# Patient Record
Sex: Female | Born: 1990 | Race: White | Hispanic: Yes | Marital: Single | Smoking: Never smoker
Health system: Southern US, Community
[De-identification: ages and names within clinical notes are randomized; demographics above are authoritative.]

## PROBLEM LIST (undated history)

## (undated) HISTORY — PX: LIPOSUCTION: SHX10

## (undated) HISTORY — PX: BREAST SURGERY: SHX581

---
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
## 2011-03-11 ENCOUNTER — Encounter: Payer: Self-pay | Admitting: *Deleted

## 2011-03-11 ENCOUNTER — Emergency Department (HOSPITAL_BASED_OUTPATIENT_CLINIC_OR_DEPARTMENT_OTHER)
Admission: EM | Admit: 2011-03-11 | Discharge: 2011-03-12 | Disposition: A | Discharge status: Home / Self Care | Payer: Self-pay | Attending: Emergency Medicine | Admitting: Emergency Medicine

## 2011-03-11 DIAGNOSIS — R109 Unspecified abdominal pain: Secondary | ICD-10-CM | POA: Insufficient documentation

## 2011-03-11 DIAGNOSIS — N390 Urinary tract infection, site not specified: Secondary | ICD-10-CM

## 2011-03-11 DIAGNOSIS — K5900 Constipation, unspecified: Secondary | ICD-10-CM | POA: Insufficient documentation

## 2011-03-11 LAB — URINALYSIS, ROUTINE W REFLEX MICROSCOPIC
Glucose, UA: NEGATIVE mg/dL
Ketones, ur: NEGATIVE mg/dL
Nitrite: POSITIVE — AB
Protein, ur: NEGATIVE mg/dL
Specific Gravity, Urine: 1.014 (ref 1.005–1.030)
Urobilinogen, UA: 0.2 mg/dL (ref 0.0–1.0)

## 2011-03-11 LAB — PREGNANCY, URINE: Preg Test, Ur: NEGATIVE

## 2011-03-11 LAB — URINE MICROSCOPIC-ADD ON

## 2011-03-11 NOTE — ED Notes (Signed)
Pt sts she has been anable to have a BM for 4 days and normally goes BID. Pt c/o abd pain and feeling bloated.

## 2011-03-12 ENCOUNTER — Emergency Department (INDEPENDENT_AMBULATORY_CARE_PROVIDER_SITE_OTHER): Payer: Self-pay

## 2011-03-12 DIAGNOSIS — R109 Unspecified abdominal pain: Secondary | ICD-10-CM

## 2011-03-12 DIAGNOSIS — K5900 Constipation, unspecified: Secondary | ICD-10-CM

## 2011-03-12 MED ORDER — NITROFURANTOIN MONOHYD MACRO 100 MG PO CAPS: 100.0000 mg | ORAL_CAPSULE | Freq: Two times a day (BID) | ORAL | Status: AC

## 2011-03-12 NOTE — ED Provider Notes (Signed)
History     CSN: [id number] Arrival date & time: 03/11/2011 11:08 PM   Chief Complaint  Patient presents with  . Constipation     (Include location/radiation/quality/duration/timing/severity/associated sxs/prior treatment) Patient is a 20 y.o. female presenting with constipation. The history is provided by the patient. No language interpreter was used.  Constipation  The current episode started 3 to 5 days ago. The onset was gradual. The problem occurs continuously. The problem has been unchanged. The pain is mild. The stool is described as hard. There was no prior successful therapy. There was no prior unsuccessful therapy. Associated symptoms include hematemesis. Pertinent negatives include no anorexia, no fever, no diarrhea, no hemorrhoids, no nausea, no rectal pain, no vomiting, no hematuria, no vaginal bleeding, no vaginal discharge, no chest pain, no headaches, no coughing and no difficulty breathing. She has been behaving normally. She has been eating and drinking normally. The last void occurred less than 6 hours ago. Her past medical history does not include abdominal surgery, developmental delay, Hirschsprung's disease, inflammatory bowel disease, recent abdominal injury, recent antibiotic use, recent change in diet or a recent illness. There were no sick contacts. She has received no recent medical care.  Normally has 2 BM a day none x 4 days.  Today had suprapubic tenderness without other urinary or vaginal symptoms.  No f/c/r.     History reviewed. No pertinent past medical history.   History reviewed. No pertinent past surgical history.  No family history on file.  History  Substance Use Topics  . Smoking status: Never Smoker   . Smokeless tobacco: Not on file  . Alcohol Use: No    OB History    Grav Para Term Preterm Abortions TAB SAB Ect Mult Living                  Review of Systems  Constitutional: Negative for fever.  HENT: Negative for facial swelling.     Eyes: Negative for discharge.  Respiratory: Negative for cough.   Cardiovascular: Negative for chest pain.  Gastrointestinal: Positive for constipation and hematemesis. Negative for nausea, vomiting, diarrhea, abdominal distention, rectal pain, anorexia and hemorrhoids.  Genitourinary: Negative for hematuria, vaginal bleeding and vaginal discharge.  Musculoskeletal: Positive for arthralgias.  Neurological: Negative for headaches.  Hematological: Negative.   Psychiatric/Behavioral: Negative.     Allergies  Review of patient's allergies indicates no known allergies.  Home Medications  No current outpatient prescriptions on file.  Physical Exam    BP 115/75  Pulse 112  Temp(Src) 98.1 F (36.7 C) (Oral)  Resp 16  Ht 5\' 7"  (1.702 m)  Wt 131 lb (59.421 kg)  BMI 20.52 kg/m2  SpO2 98%  LMP 02/26/2011  Physical Exam  Constitutional: She is oriented to person, place, and time. She appears well-developed and well-nourished. No distress.  HENT:  Head: Normocephalic and atraumatic.  Mouth/Throat: Oropharynx is clear and moist.  Eyes: EOM are normal. Pupils are equal, round, and reactive to light.  Neck: Normal range of motion. Neck supple.  Cardiovascular: Normal rate and regular rhythm.   Pulmonary/Chest: Effort normal and breath sounds normal. No respiratory distress.  Abdominal: Bowel sounds are normal. She exhibits no distension and no mass. There is no tenderness. There is no rebound and no guarding.  Musculoskeletal: Normal range of motion.  Neurological: She is alert and oriented to person, place, and time.  Skin: Skin is warm and dry.  Psychiatric: She has a normal mood and affect.    ED Course  Procedures  Results for orders placed during the hospital encounter of 03/11/11  URINALYSIS, ROUTINE W REFLEX MICROSCOPIC      Component Value Range   Color, Urine YELLOW  YELLOW    Appearance CLOUDY (*) CLEAR    Specific Gravity, Urine 1.014  1.005 - 1.030    pH 6.5  5.0 -  8.0    Glucose, UA NEGATIVE  NEGATIVE (mg/dL)   Hgb urine dipstick SMALL (*) NEGATIVE    Bilirubin Urine NEGATIVE  NEGATIVE    Ketones, ur NEGATIVE  NEGATIVE (mg/dL)   Protein, ur NEGATIVE  NEGATIVE (mg/dL)   Urobilinogen, UA 0.2  0.0 - 1.0 (mg/dL)   Nitrite POSITIVE (*) NEGATIVE    Leukocytes, UA LARGE (*) NEGATIVE   PREGNANCY, URINE      Component Value Range   Preg Test, Ur NEGATIVE    URINE MICROSCOPIC-ADD ON      Component Value Range   Squamous Epithelial / LPF FEW (*) RARE    WBC, UA TOO NUMEROUS TO COUNT  <3 (WBC/hpf)   RBC / HPF 3-6  <3 (RBC/hpf)   Bacteria, UA MANY (*) RARE    Dg Abd Acute W/chest  03/12/2011  *RADIOLOGY REPORT*  Clinical Data: 20 year old female with abdominal pain and constipation.  ACUTE ABDOMEN SERIES (ABDOMEN 2 VIEW & CHEST 1 VIEW)  Comparison: None  Findings: The cardiomediastinal silhouette is unremarkable. There is no evidence of airspace disease, pleural effusion or pneumothorax.  A moderate amount of stool in the right colon is noted. Gas in the transverse colon is identified. There is no evidence of bowel obstruction or pneumoperitoneum. No bony abnormalities are identified.  IMPRESSION: Moderate right colonic stool.  No evidence of bowel obstruction or pneumoperitoneum.  No evidence of acute cardiopulmonary disease.  Original Report Authenticated By: Rosendo Gros, M.D.     No diagnosis found.   MDM Use colace and miralax, take all antibiotics.  Return for fevers, chills, worsening pain, vomiting, or any concerns.  Follow up with your family doctor in 2 days for recheck       Nealie Mchatton K Yechiel Erny-Rasch, MD 03/12/11 [phone]

## 2011-08-21 ENCOUNTER — Emergency Department (HOSPITAL_BASED_OUTPATIENT_CLINIC_OR_DEPARTMENT_OTHER)
Admission: EM | Admit: 2011-08-21 | Discharge: 2011-08-21 | Disposition: A | Discharge status: Home / Self Care | Payer: Self-pay | Attending: Emergency Medicine | Admitting: Emergency Medicine

## 2011-08-21 ENCOUNTER — Encounter (HOSPITAL_BASED_OUTPATIENT_CLINIC_OR_DEPARTMENT_OTHER): Payer: Self-pay | Admitting: *Deleted

## 2011-08-21 DIAGNOSIS — N390 Urinary tract infection, site not specified: Secondary | ICD-10-CM | POA: Insufficient documentation

## 2011-08-21 LAB — URINALYSIS, ROUTINE W REFLEX MICROSCOPIC
Glucose, UA: NEGATIVE mg/dL
Ketones, ur: 15 mg/dL — AB
Protein, ur: 100 mg/dL — AB
Urobilinogen, UA: 1 mg/dL (ref 0.0–1.0)

## 2011-08-21 LAB — URINE MICROSCOPIC-ADD ON

## 2011-08-21 MED ORDER — NITROFURANTOIN MONOHYD MACRO 100 MG PO CAPS: 100.0000 mg | ORAL_CAPSULE | Freq: Two times a day (BID) | ORAL | Status: AC

## 2011-08-21 MED ORDER — PHENAZOPYRIDINE HCL 200 MG PO TABS: 200.0000 mg | ORAL_TABLET | Freq: Three times a day (TID) | ORAL | Status: AC

## 2011-08-21 MED ORDER — NITROFURANTOIN MONOHYD MACRO 100 MG PO CAPS
100.0000 mg | ORAL_CAPSULE | Freq: Once | ORAL | Status: AC
  Administered 2011-08-21: 100 mg via ORAL
  Filled 2011-08-21: qty 1

## 2011-08-21 MED ORDER — PHENAZOPYRIDINE HCL 100 MG PO TABS
200.0000 mg | ORAL_TABLET | Freq: Once | ORAL | Status: AC
  Administered 2011-08-21: 200 mg via ORAL
  Filled 2011-08-21 (×2): qty 1

## 2011-08-21 NOTE — Discharge Instructions (Signed)
Urinary Tract Infection Infections of the urinary tract can start in several places. A bladder infection (cystitis), a kidney infection (pyelonephritis), and a prostate infection (prostatitis) are different types of urinary tract infections (UTIs). They usually get better if treated with medicines (antibiotics) that kill germs. Take all the medicine until it is gone. You or your child may feel better in a few days, but TAKE ALL MEDICINE or the infection may not respond and may become more difficult to treat. HOME CARE INSTRUCTIONS   Drink enough water and fluids to keep the urine clear or pale yellow. Cranberry juice is especially recommended, in addition to large amounts of water.   Avoid caffeine, tea, and carbonated beverages. They tend to irritate the bladder.   Alcohol may irritate the prostate.   Only take over-the-counter or prescription medicines for pain, discomfort, or fever as directed by your caregiver.  To prevent further infections:  Empty the bladder often. Avoid holding urine for long periods of time.   After a bowel movement, women should cleanse from front to back. Use each tissue only once.   Empty the bladder before and after sexual intercourse.  FINDING OUT THE RESULTS OF YOUR TEST Not all test results are available during your visit. If your or your child's test results are not back during the visit, make an appointment with your caregiver to find out the results. Do not assume everything is normal if you have not heard from your caregiver or the medical facility. It is important for you to follow up on all test results. SEEK MEDICAL CARE IF:   There is back pain.   Your baby is older than 3 months with a rectal temperature of 100.5 F (38.1 C) or higher for more than 1 day.   Your or your child's problems (symptoms) are no better in 3 days. Return sooner if you or your child is getting worse.  SEEK IMMEDIATE MEDICAL CARE IF:   There is severe back pain or lower  abdominal pain.   You or your child develops chills.   You have a fever.   Your baby is older than 3 months with a rectal temperature of 102 F (38.9 C) or higher.   Your baby is 3 months old or younger with a rectal temperature of 100.4 F (38 C) or higher.   There is nausea or vomiting.   There is continued burning or discomfort with urination.  MAKE SURE YOU:   Understand these instructions.   Will watch your condition.   Will get help right away if you are not doing well or get worse.  Document Released: 03/18/2005 Document Revised: 02/18/2011 Document Reviewed: 10/21/2006 [employer] Patient Information 2012 [employer]  Nitrofurantoin tablets or capsules What is this medicine? NITROFURANTOIN (nye troe fyoor AN toyn) is an antibiotic. It is used to treat urinary tract infections. This medicine may be used for other purposes; ask your health care provider or pharmacist if you have questions. What should I tell my health care provider before I take this medicine? They need to know if you have any of these conditions: -anemia -diabetes -glucose-6-phosphate dehydrogenase deficiency -kidney disease -liver disease -lung disease -other chronic illness -an unusual or allergic reaction to nitrofurantoin, other antibiotics, other medicines, foods, dyes or preservatives -pregnant or trying to get pregnant -breast-feeding How should I use this medicine? Take this medicine by mouth with a glass of water. Follow the directions on the prescription label. Take this medicine with food or milk. Take   your doses at regular intervals. Do not take your medicine more often than directed. Do not stop taking except on your doctor's advice. Talk to your pediatrician regarding the use of this medicine in children. While this drug may be prescribed for selected conditions, precautions do apply. Overdosage: If you think you have taken too much of this medicine contact a poison control center or  emergency room at once. NOTE: This medicine is only for you. Do not share this medicine with others. What if I miss a dose? If you miss a dose, take it as soon as you can. If it is almost time for your next dose, take only that dose. Do not take double or extra doses. What may interact with this medicine? -antacids containing magnesium trisilicate -probenecid -quinolone antibiotics like ciprofloxacin, lomefloxacin, norfloxacin and ofloxacin -sulfinpyrazone This list may not describe all possible interactions. Give your health care provider a list of all the medicines, herbs, non-prescription drugs, or dietary supplements you use. Also tell them if you smoke, drink alcohol, or use illegal drugs. Some items may interact with your medicine. What should I watch for while using this medicine? Tell your doctor or health care professional if your symptoms do not improve or if you get new symptoms. Drink several glasses of water a day. If you are taking this medicine for a long time, visit your doctor for regular checks on your progress. If you are diabetic, you may get a false positive result for sugar in your urine with certain brands of urine tests. Check with your doctor. What side effects may I notice from receiving this medicine? Side effects that you should report to your doctor or health care professional as soon as possible: -allergic reactions like skin rash or hives, swelling of the face, lips, or tongue -chest pain -cough -difficulty breathing -dizziness, drowsiness -fever or infection -joint aches or pains -pale or blue-tinted skin -redness, blistering, peeling or loosening of the skin, including inside the mouth -tingling, burning, pain, or numbness in hands or feet -unusual bleeding or bruising -unusually weak or tired -yellowing of eyes or skin Side effects that usually do not require medical attention (report to your doctor or health care professional if they continue or are  bothersome): -dark urine -diarrhea -headache -loss of appetite -nausea or vomiting -temporary hair loss This list may not describe all possible side effects. Call your doctor for medical advice about side effects. You may report side effects to FDA at 1-800-FDA-1088. Where should I keep my medicine? Keep out of the reach of children. Store at room temperature between 15 and 30 degrees C (59 and 86 degrees F). Protect from light. Throw away any unused medicine after the expiration date. NOTE: This sheet is a summary. It may not cover all possible information. If you have questions about this medicine, talk to your doctor, pharmacist, or health care provider.  2012, [employer]. (12/28/2007 3:56:47 PM)Phenazopyridine tablets What is this medicine? PHENAZOPYRIDINE (fen az oh PEER i deen) is a pain reliever. It is used to stop the pain, burning, or discomfort caused by infection or irritation of the urinary tract. This medicine is not an antibiotic. It will not cure a urinary tract infection. This medicine may be used for other purposes; ask your health care provider or pharmacist if you have questions. What should I tell my health care provider before I take this medicine? They need to know if you have any of these conditions: -glucose-6-phosphate dehydrogenase (G6PD) deficiency -kidney disease -an   unusual or allergic reaction to phenazopyridine, other medicines, foods, dyes, or preservatives -pregnant or trying to get pregnant -breast-feeding How should I use this medicine? Take this medicine by mouth with a glass of water. Follow the directions on the prescription label. Take after meals. Take your doses at regular intervals. Do not take your medicine more often than directed. Do not skip doses or stop your medicine early even if you feel better. Do not stop taking except on your doctor's advice. Talk to your pediatrician regarding the use of this medicine in children. Special care  may be needed. Overdosage: If you think you have taken too much of this medicine contact a poison control center or emergency room at once. NOTE: This medicine is only for you. Do not share this medicine with others. What if I miss a dose? If you miss a dose, take it as soon as you can. If it is almost time for your next dose, take only that dose. Do not take double or extra doses. What may interact with this medicine? Interactions are not expected. This list may not describe all possible interactions. Give your health care provider a list of all the medicines, herbs, non-prescription drugs, or dietary supplements you use. Also tell them if you smoke, drink alcohol, or use illegal drugs. Some items may interact with your medicine. What should I watch for while using this medicine? Tell your doctor or health care professional if your symptoms do not improve or if they get worse. This medicine colors body fluids red. This effect is harmless and will go away after you are done taking the medicine. It will change urine to an dark orange or red color. The red color may stain clothing. Soft contact lenses may become permanently stained. It is best not to wear soft contact lenses while taking this medicine. If you are diabetic you may get a false positive result for sugar in your urine. Talk to your health care provider. What side effects may I notice from receiving this medicine? Side effects that you should report to your doctor or health care professional as soon as possible: -allergic reactions like skin rash, itching or hives, swelling of the face, lips, or tongue -blue or purple color of the skin -difficulty breathing -fever -less urine -unusual bleeding, bruising -unusual tired, weak -vomiting -yellowing of the eyes or skin Side effects that usually do not require medical attention (report to your doctor or health care professional if they continue or are bothersome): -dark  urine -headache -stomach upset This list may not describe all possible side effects. Call your doctor for medical advice about side effects. You may report side effects to FDA at 1-800-FDA-1088. Where should I keep my medicine? Keep out of the reach of children. Store at room temperature between 15 and 30 degrees C (59 and 86 degrees F). Protect from light and moisture. Throw away any unused medicine after the expiration date. NOTE: This sheet is a summary. It may not cover all possible information. If you have questions about this medicine, talk to your doctor, pharmacist, or health care provider.  2012, [employer]. (01/05/2008 11:04:07 AM) 

## 2011-08-21 NOTE — ED Notes (Signed)
Lower abdominal pain. Dysuria today.

## 2011-08-21 NOTE — ED Provider Notes (Signed)
History     CSN: [id number]  Arrival date & time 08/21/11  2218   First MD Initiated Contact with Patient 08/21/11 [id number]      Chief Complaint  Patient presents with  . Urinary Tract Infection    (Consider location/radiation/quality/duration/timing/severity/associated sxs/prior treatment) Patient is a 21 y.o. female presenting with urinary tract infection. The history is provided by the patient.  Urinary Tract Infection  She has been having mild suprapubic pain for the last 5 days which she thought was due to premenstrual pain. Today, she started having dysuria as well as urinary urgency, frequency, and tenesmus. Suprapubic pain has gotten worse with the new symptoms, and is worse with urination. Pain is 6/10 at rest, but 9/10 when she urinates. She denies flank pain. She denies fever, chills, sweats. She denies nausea, vomiting, diarrhea. She has had urinary tract infections in the past and they felt similar to this. Symptoms are described as moderate to severe. Nothing makes it better and nothing makes it worse.  History reviewed. No pertinent past medical history.  History reviewed. No pertinent past surgical history.  No family history on file.  History  Substance Use Topics  . Smoking status: Never Smoker   . Smokeless tobacco: Not on file  . Alcohol Use: No    OB History    Grav Para Term Preterm Abortions TAB SAB Ect Mult Living                  Review of Systems  All other systems reviewed and are negative.    Allergies  Review of patient's allergies indicates no known allergies.  Home Medications  No current outpatient prescriptions on file.  BP 111/66  Pulse 72  Temp(Src) 97.9 F (36.6 C) (Oral)  Resp 18  SpO2 100%  LMP 07/29/2011  Physical Exam  Nursing note and vitals reviewed.  20 year old female who is resting comfortably and in no acute distress. Vital signs are normal. Oxygen saturation is 100% which is normal. Head is normocephalic and  atraumatic. PERRLA, EOMI. Oropharynx is clear. Neck is nontender and supple without adenopathy or JVD. Back is nontender and there is no CVA tenderness. Lungs are clear without rales, wheezes, or rhonchi. Heart has regular rate and rhythm without murmur. Abdomen is soft, flat, with mild suprapubic tenderness. There is no rebound or guarding. Peristalsis is normal active. Extremities have no cyanosis or edema, full range of motion is present. Skin is warm and dry without rash. Neurologic: Mental status is normal, cranial nerves are intact, there no focal motor or sensory deficits.  ED Course  Procedures (including critical care time)  Results for orders placed during the hospital encounter of 08/21/11  URINALYSIS, ROUTINE W REFLEX MICROSCOPIC      Component Value Range   Color, Urine YELLOW  YELLOW    APPearance TURBID (*) CLEAR    Specific Gravity, Urine 1.034 (*) 1.005 - 1.030    pH 6.0  5.0 - 8.0    Glucose, UA NEGATIVE  NEGATIVE (mg/dL)   Hgb urine dipstick LARGE (*) NEGATIVE    Bilirubin Urine NEGATIVE  NEGATIVE    Ketones, ur 15 (*) NEGATIVE (mg/dL)   Protein, ur 782 (*) NEGATIVE (mg/dL)   Urobilinogen, UA 1.0  0.0 - 1.0 (mg/dL)   Nitrite POSITIVE (*) NEGATIVE    Leukocytes, UA LARGE (*) NEGATIVE   PREGNANCY, URINE      Component Value Range   Preg Test, Ur NEGATIVE  NEGATIVE   URINE MICROSCOPIC-ADD ON  Component Value Range   Squamous Epithelial / LPF FEW (*) RARE    WBC, UA TOO NUMEROUS TO COUNT  <3 (WBC/hpf)   RBC / HPF 11-20  <3 (RBC/hpf)   Bacteria, UA FEW (*) RARE    Urinalysis confirms urinary tract infection. She's given a dose of nitrofurantoin and phenazopyradine in the emergency department and sent home with prescriptions for both of those.   1. Urinary tract infection       MDM  Probable urinary tract infection. Old charts have been reviewed and she does have prior ED visits for urinary tract infection.        Dione Booze, MD 08/21/11 [phone]

## 2012-04-25 ENCOUNTER — Encounter (HOSPITAL_BASED_OUTPATIENT_CLINIC_OR_DEPARTMENT_OTHER): Payer: Self-pay | Admitting: *Deleted

## 2012-04-25 ENCOUNTER — Emergency Department (HOSPITAL_BASED_OUTPATIENT_CLINIC_OR_DEPARTMENT_OTHER)
Admission: EM | Admit: 2012-04-25 | Discharge: 2012-04-25 | Disposition: A | Discharge status: Home / Self Care | Payer: Self-pay | Attending: Emergency Medicine | Admitting: Emergency Medicine

## 2012-04-25 DIAGNOSIS — H109 Unspecified conjunctivitis: Secondary | ICD-10-CM | POA: Insufficient documentation

## 2012-04-25 MED ORDER — FLUORESCEIN SODIUM 1 MG OP STRP
1.0000 | ORAL_STRIP | Freq: Once | OPHTHALMIC | Status: AC
  Administered 2012-04-25: 1 via OPHTHALMIC
  Filled 2012-04-25: qty 1

## 2012-04-25 MED ORDER — CIPROFLOXACIN HCL 0.3 % OP SOLN: 2.0000 [drp] | OPHTHALMIC | Status: DC

## 2012-04-25 MED ORDER — CIPROFLOXACIN HCL 0.3 % OP SOLN
OPHTHALMIC | Status: AC
  Filled 2012-04-25: qty 2.5

## 2012-04-25 MED ORDER — TETRACAINE HCL 0.5 % OP SOLN
2.0000 [drp] | Freq: Once | OPHTHALMIC | Status: AC
  Administered 2012-04-25: 2 [drp] via OPHTHALMIC
  Filled 2012-04-25: qty 2

## 2012-04-25 NOTE — ED Provider Notes (Signed)
History     CSN: [id number]  Arrival date & time 04/25/12  7829   First MD Initiated Contact with Patient 04/25/12 [phone]      Chief Complaint  Patient presents with  . Conjunctivitis    (Consider location/radiation/quality/duration/timing/severity/associated sxs/prior treatment) HPI Pt with history of contact lens use reports she woke up this morning with crusting of her L eye, redness, irritation and mild photophobia. She did not put her contact lenses in today. She reports a couple of days of rhinorrhea she attributed to allergies.   History reviewed. No pertinent past medical history.  History reviewed. No pertinent past surgical history.  History reviewed. No pertinent family history.  History  Substance Use Topics  . Smoking status: Never Smoker   . Smokeless tobacco: Not on file  . Alcohol Use: No    OB History    Grav Para Term Preterm Abortions TAB SAB Ect Mult Living                  Review of Systems All other systems reviewed and are negative except as noted in HPI.   Allergies  Review of patient's allergies indicates no known allergies.  Home Medications   Current Outpatient Rx  Name  Route  Sig  Dispense  Refill  . IBUPROFEN 200 MG PO CAPS   Oral   Take 2 capsules by mouth daily as needed. Patient used this medication for what she thought may be cramps.           BP 123/76  Pulse 92  Temp 98.4 F (36.9 C) (Oral)  Resp 18  Ht 5\' 6"  (1.676 m)  Wt 143 lb (64.864 kg)  BMI 23.08 kg/m2  SpO2 100%  Physical Exam  Constitutional: She is oriented to person, place, and time. She appears well-developed and well-nourished.  HENT:  Head: Normocephalic and atraumatic.  Eyes: Pupils are equal, round, and reactive to light. Right eye exhibits no discharge. Left eye exhibits discharge.       conjunctival injection and mild chemosis on the Left. No corneal abrasion on fluorescein exam. No foreign body.  Neck: Neck supple.  Pulmonary/Chest: Effort  normal.  Neurological: She is alert and oriented to person, place, and time. No cranial nerve deficit.  Psychiatric: She has a normal mood and affect. Her behavior is normal.    ED Course  Procedures (including critical care time)  Labs Reviewed - No data to display No results found.   No diagnosis found.    MDM  Cipro drops, advised to dispose of her current contacts and not wear any more until symptoms have cleared.         Charles B. Bernette Mayers, MD 04/25/12 [phone]

## 2012-04-25 NOTE — ED Notes (Signed)
Woke up with left eye red crusty and burning sensitive to light

## 2012-12-14 ENCOUNTER — Emergency Department (HOSPITAL_BASED_OUTPATIENT_CLINIC_OR_DEPARTMENT_OTHER)
Admission: EM | Admit: 2012-12-14 | Discharge: 2012-12-14 | Disposition: A | Discharge status: Home / Self Care | Payer: Self-pay | Attending: Emergency Medicine | Admitting: Emergency Medicine

## 2012-12-14 ENCOUNTER — Emergency Department (HOSPITAL_BASED_OUTPATIENT_CLINIC_OR_DEPARTMENT_OTHER): Payer: Self-pay

## 2012-12-14 ENCOUNTER — Encounter (HOSPITAL_BASED_OUTPATIENT_CLINIC_OR_DEPARTMENT_OTHER): Payer: Self-pay

## 2012-12-14 DIAGNOSIS — K5900 Constipation, unspecified: Secondary | ICD-10-CM | POA: Insufficient documentation

## 2012-12-14 DIAGNOSIS — R109 Unspecified abdominal pain: Secondary | ICD-10-CM | POA: Insufficient documentation

## 2012-12-14 DIAGNOSIS — Z3202 Encounter for pregnancy test, result negative: Secondary | ICD-10-CM | POA: Insufficient documentation

## 2012-12-14 LAB — URINALYSIS, ROUTINE W REFLEX MICROSCOPIC
Glucose, UA: NEGATIVE mg/dL
Nitrite: NEGATIVE
Protein, ur: NEGATIVE mg/dL
Urobilinogen, UA: 0.2 mg/dL (ref 0.0–1.0)

## 2012-12-14 LAB — PREGNANCY, URINE: Preg Test, Ur: NEGATIVE

## 2012-12-14 MED ORDER — POLYETHYLENE GLYCOL 3350 17 G PO PACK: 17.0000 g | PACK | Freq: Every day | ORAL | Status: DC

## 2012-12-14 NOTE — ED Provider Notes (Signed)
History    CSN: [id number] Arrival date & time 12/14/12  1200  First MD Initiated Contact with Patient 12/14/12 1229     Chief Complaint  Patient presents with  . Constipation  . Abdominal Pain   (Consider location/radiation/quality/duration/timing/severity/associated sxs/prior Treatment) Patient is a 22 y.o. female presenting with constipation and abdominal pain. The history is provided by the patient. No language interpreter was used.  Constipation Severity:  Mild Timing:  Constant Progression:  Worsening Chronicity:  New Stool description:  Hard Relieved by:  Nothing Worsened by:  Nothing tried Associated symptoms: abdominal pain   Abdominal Pain Associated symptoms include abdominal pain.  Pt complains of constipation.   Pt reports she was straining and almost passed out from straining so hard.   History reviewed. No pertinent past medical history. History reviewed. No pertinent past surgical history. No family history on file. History  Substance Use Topics  . Smoking status: Never Smoker   . Smokeless tobacco: Not on file  . Alcohol Use: No   OB History   Grav Para Term Preterm Abortions TAB SAB Ect Mult Living                 Review of Systems  Gastrointestinal: Positive for abdominal pain and constipation.  All other systems reviewed and are negative.    Allergies  Review of patient's allergies indicates no known allergies.  Home Medications   Current Outpatient Rx  Name  Route  Sig  Dispense  Refill  . Ibuprofen (ADVIL) 200 MG CAPS   Oral   Take 2 capsules by mouth daily as needed. Patient used this medication for what she thought may be cramps.         . polyethylene glycol (MIRALAX) packet   Oral   Take 17 g by mouth daily.   14 each   0    BP 104/56  Pulse 86  Temp(Src) 98.5 F (36.9 C) (Oral)  Resp 16  Ht 5\' 6"  (1.676 m)  Wt 140 lb (63.504 kg)  BMI 22.61 kg/m2  SpO2 100%  LMP 11/30/2012 Physical Exam  Nursing note and vitals  reviewed. Constitutional: She appears well-developed and well-nourished.  HENT:  Head: Normocephalic.  Eyes: Pupils are equal, round, and reactive to light.  Neck: Normal range of motion.  Cardiovascular: Normal rate.   Pulmonary/Chest: Effort normal.  Abdominal: Soft. Bowel sounds are normal. There is no tenderness.  Musculoskeletal: Normal range of motion.  Neurological: She is alert.  Skin: Skin is warm.  Psychiatric: She has a normal mood and affect.    ED Course  Procedures (including critical care time) Labs Reviewed  URINALYSIS, ROUTINE W REFLEX MICROSCOPIC - Abnormal; Notable for the following:    Leukocytes, UA SMALL (*)    All other components within normal limits  PREGNANCY, URINE  URINE MICROSCOPIC-ADD ON   Dg Abd 1 View  12/14/2012   *RADIOLOGY REPORT*  Clinical Data: 22 year old female with abdominal pain and tenderness.  ABDOMEN - 1 VIEW  Comparison: 03/12/2011.  Findings: Nonobstructed bowel gas pattern.  There is retained stool in the colon, increased from prior.  The rectosigmoid colon and distal descending are relatively spared.  Abdominal and pelvic visceral contours are within normal limits. No osseous abnormality identified.  No definite pneumoperitoneum.  IMPRESSION: Nonobstructed bowel gas pattern.   Original Report Authenticated By: Erskine Speed, M.D.   1. Constipation    Negative ua and upt negative.  MDM  Pt given rx for miralax,  I advised increase fluids, add fiber cereal to diet  Elson Areas, PA-C 12/14/12 1538

## 2012-12-14 NOTE — ED Provider Notes (Signed)
Medical screening examination/treatment/procedure(s) were performed by non-physician practitioner and as supervising physician I was immediately available for consultation/collaboration.   Charles B. Bernette Mayers, MD 12/14/12 1539

## 2012-12-14 NOTE — ED Notes (Signed)
Pt states she is constipated-was in the [place] attempting to deficate when she became dizzy and lightheaded-pt A?O -was in w/c in ED [facility]

## 2014-03-05 DIAGNOSIS — K5904 Chronic idiopathic constipation: Secondary | ICD-10-CM | POA: Insufficient documentation

## 2017-09-23 DIAGNOSIS — L0231 Cutaneous abscess of buttock: Secondary | ICD-10-CM | POA: Diagnosis present

## 2017-09-23 DIAGNOSIS — Z79899 Other long term (current) drug therapy: Secondary | ICD-10-CM | POA: Insufficient documentation

## 2017-09-23 DIAGNOSIS — R030 Elevated blood-pressure reading, without diagnosis of hypertension: Secondary | ICD-10-CM | POA: Diagnosis not present

## 2017-09-24 ENCOUNTER — Encounter (HOSPITAL_BASED_OUTPATIENT_CLINIC_OR_DEPARTMENT_OTHER): Payer: Self-pay

## 2017-09-24 ENCOUNTER — Emergency Department (HOSPITAL_BASED_OUTPATIENT_CLINIC_OR_DEPARTMENT_OTHER)
Admission: EM | Admit: 2017-09-24 | Discharge: 2017-09-24 | Disposition: A | Discharge status: Home / Self Care | Payer: Federal, State, Local not specified - PPO | Attending: Emergency Medicine | Admitting: Emergency Medicine

## 2017-09-24 ENCOUNTER — Other Ambulatory Visit: Payer: Self-pay

## 2017-09-24 DIAGNOSIS — L0231 Cutaneous abscess of buttock: Secondary | ICD-10-CM

## 2017-09-24 LAB — PREGNANCY, URINE: Preg Test, Ur: NEGATIVE

## 2017-09-24 MED ORDER — DOXYCYCLINE HYCLATE 100 MG PO TABS
100.0000 mg | ORAL_TABLET | Freq: Once | ORAL | Status: AC
  Administered 2017-09-24: 100 mg via ORAL
  Filled 2017-09-24: qty 1

## 2017-09-24 MED ORDER — DOXYCYCLINE HYCLATE 100 MG PO CAPS
100.0000 mg | ORAL_CAPSULE | Freq: Two times a day (BID) | ORAL | 0 refills | Status: DC
Start: 2017-09-24 — End: 2023-10-05

## 2017-09-24 NOTE — ED Triage Notes (Signed)
Pt reports having multiple abcessess to her vagina. Pt reports pus drainage to one. Pt has seen OBGYN- no abx, no drainage. Was told they were boils.

## 2017-09-24 NOTE — ED Provider Notes (Signed)
[facility] Provider Note: Deborah DellJ. Lane Jordi Kamm, MD, FACEP  CSN: [id number] MRN: [id number] ARRIVAL: 09/23/17 at 2354 ROOM: [facility]12/[facility]12   CHIEF COMPLAINT  Abscess   HISTORY OF PRESENT ILLNESS  09/24/17 2:23 AM Deborah Rodgers is a 27 y.o. female who has had several tender, indurated lesions of the medial buttocks and mons pubis beginning about 2 and half weeks ago.  1 of them opened on its own and drained foul-smelling pus.  [facility] and tested for STDs and was told she was negative.  They told her she had boils but did not I&D them or place her on an antibiotic.  She states the lesions are moderately painful especially with walking.   History reviewed. No pertinent past medical history.  History reviewed. No pertinent surgical history.  No family history on file.  Social History   Tobacco Use  . Smoking status: Never Smoker  . Smokeless tobacco: Never Used  Substance Use Topics  . Alcohol use: No  . Drug use: No    Prior to Admission medications   Medication Sig Start Date End Date Taking? Authorizing Provider  Ibuprofen (ADVIL) 200 MG CAPS Take 2 capsules by mouth daily as needed. Patient used this medication for what she thought may be cramps.    [provider]  polyethylene glycol (MIRALAX) packet Take 17 g by mouth daily. 12/14/12   Elson AreasSofia, Leslie K, PA-C    Allergies Patient has no known allergies.   REVIEW OF SYSTEMS  Negative except as noted here or in the History of Present Illness.   PHYSICAL EXAMINATION  Initial Vital Signs Blood pressure (!) 144/100, pulse 89, temperature 98.9 F (37.2 C), temperature source Oral, resp. rate 18, height 5\' 6"  (1.676 m), weight 82.6 kg (182 lb), last menstrual period 09/16/2017, SpO2 99 %.  Examination General: Well-developed, well-nourished female in no acute distress; appearance consistent with age of record HENT: normocephalic; atraumatic Eyes: pupils equal,  round and reactive to light; extraocular muscles intact Neck: supple Heart: regular rate and rhythm Lungs: clear to auscultation bilaterally Abdomen: soft; nondistended; nontender; no masses or hepatosplenomegaly; bowel sounds present GU: Normal external genitalia Extremities: No deformity; full range of motion; pulses normal Neurologic: Awake, alert and oriented; motor function intact in all extremities and symmetric; no facial droop Skin: Warm and dry; several soft, nonfluctuant papular lesions of the medial buttocks consistent with early or resolving abscesses Psychiatric: Normal mood and affect   RESULTS  Summary of this visit's results, reviewed by myself:   EKG Interpretation  Date/Time:    Ventricular Rate:    PR Interval:    QRS Duration:   QT Interval:    QTC Calculation:   R Axis:     Text Interpretation:        Laboratory Studies: Results for orders placed or performed during the hospital encounter of 09/24/17 (from the past 24 hour(s))  Pregnancy, urine     Status: None   Collection Time: 09/24/17  2:32 AM  Result Value Ref Range   Preg Test, Ur NEGATIVE NEGATIVE   Imaging Studies: No results found.  ED COURSE  Nursing notes and initial vitals signs, including pulse oximetry, reviewed.  Vitals:   09/24/17 0001 09/24/17 0003 09/24/17 0232  BP: (!) 144/100  (!) 142/87  Pulse: 89  91  Resp: 18  16  Temp:  98.9 F (37.2 C)   TempSrc: Oral Oral   SpO2: 99%  97%  Weight: 82.6 kg (182 lb)    Height: 5\' 6"  (1.676 m)     The patient appears to have several abscesses either early or resolving.  I do not believe I&D is indicated for any of these at this point.  We will treat with an antibiotic and have her return if symptoms worsen.  PROCEDURES    ED DIAGNOSES     ICD-10-CM   1. Abscess of buttock L02.31        Laisa Larrick, Jonny Ruiz, MD 09/24/17 [phone]

## 2017-10-28 HISTORY — PX: LIPOSUCTION W/ FAT INJECTION: SHX1972

## 2017-11-04 ENCOUNTER — Emergency Department (HOSPITAL_BASED_OUTPATIENT_CLINIC_OR_DEPARTMENT_OTHER)
Admission: EM | Admit: 2017-11-04 | Discharge: 2017-11-04 | Disposition: A | Discharge status: Home / Self Care | Payer: Federal, State, Local not specified - PPO | Attending: Physician Assistant | Admitting: Physician Assistant

## 2017-11-04 ENCOUNTER — Encounter (HOSPITAL_BASED_OUTPATIENT_CLINIC_OR_DEPARTMENT_OTHER): Payer: Self-pay

## 2017-11-04 ENCOUNTER — Other Ambulatory Visit: Payer: Self-pay

## 2017-11-04 DIAGNOSIS — Z79899 Other long term (current) drug therapy: Secondary | ICD-10-CM | POA: Insufficient documentation

## 2017-11-04 DIAGNOSIS — R519 Headache, unspecified: Secondary | ICD-10-CM

## 2017-11-04 DIAGNOSIS — R51 Headache: Secondary | ICD-10-CM | POA: Insufficient documentation

## 2017-11-04 LAB — COMPREHENSIVE METABOLIC PANEL
ALBUMIN: 3.1 g/dL — AB (ref 3.5–5.0)
ALK PHOS: 49 U/L (ref 38–126)
ALT: 72 U/L — ABNORMAL HIGH (ref 14–54)
AST: 52 U/L — AB (ref 15–41)
Anion gap: 9 (ref 5–15)
BILIRUBIN TOTAL: 0.4 mg/dL (ref 0.3–1.2)
BUN: 11 mg/dL (ref 6–20)
CALCIUM: 8.7 mg/dL — AB (ref 8.9–10.3)
CO2: 24 mmol/L (ref 22–32)
CREATININE: 0.7 mg/dL (ref 0.44–1.00)
Chloride: 105 mmol/L (ref 101–111)
GFR calc Af Amer: >60 mL/min (ref >60)
GFR calc non Af Amer: >60 mL/min (ref >60)
GLUCOSE: 84 mg/dL (ref 65–99)
Potassium: 3.9 mmol/L (ref 3.5–5.1)
Sodium: 138 mmol/L (ref 135–145)
Total Protein: 6.7 g/dL (ref 6.5–8.1)

## 2017-11-04 LAB — CBC
HEMATOCRIT: 30.9 % — AB (ref 36.0–46.0)
HEMOGLOBIN: 10.3 g/dL — AB (ref 12.0–15.0)
MCH: 26.7 pg (ref 26.0–34.0)
MCHC: 33.3 g/dL (ref 30.0–36.0)
MCV: 80.1 fL (ref 78.0–100.0)
Platelets: 330 10*3/uL (ref 150–400)
RBC: 3.86 MIL/uL — ABNORMAL LOW (ref 3.87–5.11)
RDW: 13.2 % (ref 11.5–15.5)
WBC: 9.3 10*3/uL (ref 4.0–10.5)

## 2017-11-04 MED ORDER — SODIUM CHLORIDE 0.9 % IV BOLUS
500.0000 mL | Freq: Once | INTRAVENOUS | Status: AC
  Administered 2017-11-04: 500 mL via INTRAVENOUS

## 2017-11-04 NOTE — ED Provider Notes (Signed)
[facility] EMERGENCY DEPARTMENT Provider Note   CSN: [id number] Arrival date & time: 11/04/17  1649     History   Chief Complaint Chief Complaint  Patient presents with  . Headache    HPI Deborah Rodgers is a 27 y.o. female presenting for evaluation of headache.  Patient states that since Monday, she has been having intermittent right-sided headache.  She states that it lasts for a few hours before resolving without intervention.  She has tried Tylenol, ibuprofen, Percocet without improvement the pain.  When pain is there, she has mild associated photophobia without phonophobia, nausea, or vomiting.  She denies vision changes, slurred speech, weakness, numbness, neck pain.  She denies fall, trauma, or injury.  She had surgery in [place] on Thursday prior to the headache.  She flew home from [place] on Tuesday.  She denies fevers, chills, chest pain, shortness of breath, nausea, vomiting, abdominal pain, urinary symptoms, abnormal bowel movements.  She states that her urine is darker than normal.  She reports occasional mild dizziness when going from sitting to standing, which improves when she is standing for extended periods of time.   HPI  History reviewed. No pertinent past medical history.  There are no active problems to display for this patient.   Past Surgical History:  Procedure Laterality Date  . LIPOSUCTION W/ FAT INJECTION  10/28/2017     OB History   None      Home Medications    Prior to Admission medications   Medication Sig Start Date End Date Taking? Authorizing Provider  amoxicillin (AMOXIL) 500 MG tablet Take 500 mg by mouth 2 (two) times daily.   Yes [provider]  doxycycline (VIBRAMYCIN) 100 MG capsule Take 1 capsule (100 mg total) by mouth 2 (two) times daily. One po bid x 7 days 09/24/17  Yes Molpus, John, MD  oxycodone-acetaminophen (PERCOCET) 2.5-325 MG tablet Take 1 tablet by mouth every 4 (four) hours as needed for pain.    Yes [provider]    Family History No family history on file.  Social History Social History   Tobacco Use  . Smoking status: Never Smoker  . Smokeless tobacco: Never Used  Substance Use Topics  . Alcohol use: Yes  . Drug use: No     Allergies   Patient has no known allergies.   Review of Systems Review of Systems  Eyes: Positive for photophobia (occasional).  Neurological: Positive for dizziness (not currently) and headaches (intermittent, not currently).  All other systems reviewed and are negative.    Physical Exam Updated Vital Signs BP 138/80 (BP Location: Right Arm)   Pulse 99   Temp 97.9 F (36.6 C) (Oral)   Resp 18   Ht  (1.676 m)   Wt 82.1 kg (181 lb)   LMP 10/17/2017   SpO2 99%   BMI 29.21 kg/m   Physical Exam  Constitutional: She is oriented to person, place, and time. She appears well-developed and well-nourished. No distress.  Resting comfortably in no apparent distress  HENT:  Head: Normocephalic and atraumatic.  Right Ear: Tympanic membrane, external ear and ear canal normal.  Left Ear: Tympanic membrane, external ear and ear canal normal.  Nose: Nose normal.  Mouth/Throat: Uvula is midline, oropharynx is clear and moist and mucous membranes are normal.  No obvious head injury.  No tenderness palpation of the head.  Eyes: Pupils are equal, round, and reactive to light. Conjunctivae and EOM are normal.  EOMI and PERRLA.  No nystagmus.  Neck: Normal range of motion. Neck supple.  Cardiovascular: Normal rate, regular rhythm and intact distal pulses.  Pulmonary/Chest: Effort normal and breath sounds normal. No respiratory distress. She has no wheezes.  Abdominal: Soft. She exhibits no distension and no mass. There is no tenderness. There is no guarding.  Musculoskeletal: Normal range of motion. She exhibits no edema or tenderness.  Intact x4.  Sensation intact x4.  Radial and pedal pulses intact bilaterally.  Color and warmth  equal bilaterally.  Soft compartments.  Patient ambulatory.  Neurological: She is alert and oriented to person, place, and time. She has normal strength and normal reflexes. No cranial nerve deficit or sensory deficit. Coordination and gait normal. GCS eye subscore is 4. GCS verbal subscore is 5. GCS motor subscore is 6.  Skin: Skin is warm and dry.  Incision sites on anterior abdomen, buttock, and back without drainage or surrounding erythema.  Psychiatric: She has a normal mood and affect.  Nursing note and vitals reviewed.    ED Treatments / Results  Labs (all labs ordered are listed, but only abnormal results are displayed) Labs Reviewed  CBC - Abnormal; Notable for the following components:      Result Value   RBC 3.86 (*)    Hemoglobin 10.3 (*)    HCT 30.9 (*)    All other components within normal limits  COMPREHENSIVE METABOLIC PANEL - Abnormal; Notable for the following components:   Calcium 8.7 (*)    Albumin 3.1 (*)    AST 52 (*)    ALT 72 (*)    All other components within normal limits    EKG None  Radiology No results found.  Procedures Procedures (including critical care time)  Medications Ordered in ED Medications  sodium chloride 0.9 % bolus 500 mL (0 mLs Intravenous Stopped 11/04/17 1829)     Initial Impression / Assessment and Plan / ED Course  I have reviewed the triage vital signs and the nursing notes.  Pertinent labs & imaging results that were available during my care of the patient were reviewed by me and considered in my medical decision making (see chart for details).     Pt presenting for evaluation of intermittent headache.  No headache at this time.  Physical exam reassuring, no neurologic deficits.  Doubt CVA, migraine, intracranial bleed.  Likely dehydration.  Will obtain basic labs further evaluation and give bolus of fluids.  Labs reassuring, no leukocytosis.  No significant dehydration.  Creatinine stable.  Enzymes mildly elevated,  bili normal.  Hemoglobin lowered from previous, likely due to surgery and dilution.  Discussed with patient.  Discussed follow-up with primary care for further evaluation of hemoglobin and liver enzymes.  Discussed importance of hydration.  At this time, patient appears safe for discharge.  Return precautions given.  Patient states she understands and agrees to plan.   Final Clinical Impressions(s) / ED Diagnoses   Final diagnoses:  Acute nonintractable headache, unspecified headache type    ED Discharge Orders    None       Alveria Apley, PA-C 11/04/17 1944    Abelino Derrick, MD 11/04/17 2320

## 2017-11-04 NOTE — ED Notes (Signed)
ED Provider at bedside. 

## 2017-11-04 NOTE — Discharge Instructions (Addendum)
Make sure you are staying well-hydrated with water.  You should be drinking enough water so that your urine is clear to pale yellow. Follow up with your primary care doctor on Monday for reevaluation of your blood work, looking at your blood levels and your liver. Return to the emergency room if you develop bleeding, vision changes, slurred speech, numbness, weakness, thunderclap headache, or any new or concerning symptoms.

## 2017-11-04 NOTE — ED Triage Notes (Signed)
Pt reports frontal HA x 3 days, pt has had tylenol/motrin/percocet with no relief. Pt had surgery last Thursday. Denies N/V, dizziness and not sensitive to light or noise.

## 2018-09-01 ENCOUNTER — Encounter (HOSPITAL_BASED_OUTPATIENT_CLINIC_OR_DEPARTMENT_OTHER): Payer: Self-pay | Admitting: Emergency Medicine

## 2018-09-01 ENCOUNTER — Other Ambulatory Visit: Payer: Self-pay

## 2018-09-01 ENCOUNTER — Emergency Department (HOSPITAL_BASED_OUTPATIENT_CLINIC_OR_DEPARTMENT_OTHER): Payer: 59

## 2018-09-01 ENCOUNTER — Emergency Department (HOSPITAL_BASED_OUTPATIENT_CLINIC_OR_DEPARTMENT_OTHER)
Admission: EM | Admit: 2018-09-01 | Discharge: 2018-09-01 | Disposition: A | Discharge status: Home / Self Care | Payer: 59 | Attending: Emergency Medicine | Admitting: Emergency Medicine

## 2018-09-01 DIAGNOSIS — X509XXA Other and unspecified overexertion or strenuous movements or postures, initial encounter: Secondary | ICD-10-CM | POA: Insufficient documentation

## 2018-09-01 DIAGNOSIS — Y929 Unspecified place or not applicable: Secondary | ICD-10-CM | POA: Insufficient documentation

## 2018-09-01 DIAGNOSIS — S8392XA Sprain of unspecified site of left knee, initial encounter: Secondary | ICD-10-CM | POA: Diagnosis not present

## 2018-09-01 DIAGNOSIS — Y9302 Activity, running: Secondary | ICD-10-CM | POA: Insufficient documentation

## 2018-09-01 DIAGNOSIS — Y999 Unspecified external cause status: Secondary | ICD-10-CM | POA: Diagnosis not present

## 2018-09-01 DIAGNOSIS — S86912A Strain of unspecified muscle(s) and tendon(s) at lower leg level, left leg, initial encounter: Secondary | ICD-10-CM

## 2018-09-01 DIAGNOSIS — S8992XA Unspecified injury of left lower leg, initial encounter: Secondary | ICD-10-CM | POA: Diagnosis present

## 2018-09-01 MED ORDER — NAPROXEN 500 MG PO TABS: 500.0000 mg | ORAL_TABLET | Freq: Two times a day (BID) | ORAL | 0 refills | Status: DC | PRN

## 2018-09-01 MED ORDER — KETOROLAC TROMETHAMINE 60 MG/2ML IM SOLN
60.0000 mg | Freq: Once | INTRAMUSCULAR | Status: AC
  Administered 2018-09-01: 60 mg via INTRAMUSCULAR
  Filled 2018-09-01: qty 2

## 2018-09-01 NOTE — ED Triage Notes (Signed)
Pt c/o LLE pain (knee) s/p working out yesterday; sts she felt "a shift up and down" and has not been able to extend leg fully since

## 2018-09-01 NOTE — ED Provider Notes (Signed)
[facility] EMERGENCY DEPARTMENT Provider Note   CSN: [id number] Arrival date & time: 09/01/18  0932    History   Chief Complaint Chief Complaint  Patient presents with  . Leg Pain    HPI Deborah Rodgers is a 28 y.o. female.     Patient presents with left knee pain and decreased mobility since yesterday.  Patient was doing sprints and felt the sudden shift up and down of the left knee is not bound to move her leg more than a few centimeters since.  Left leg stuck in partial flexion.  No other injuries.  No pop sensation.  No history of left knee issues or orthopedic issues.  Patient took over-the-counter meds and 1 of her mother's oxycodones with minimal relief.     History reviewed. No pertinent past medical history.  There are no active problems to display for this patient.   Past Surgical History:  Procedure Laterality Date  . LIPOSUCTION    . LIPOSUCTION W/ FAT INJECTION  10/28/2017     OB History   No obstetric history on file.      Home Medications    Prior to Admission medications   Medication Sig Start Date End Date Taking? Authorizing Provider  amoxicillin (AMOXIL) 500 MG tablet Take 500 mg by mouth 2 (two) times daily.    [provider]  doxycycline (VIBRAMYCIN) 100 MG capsule Take 1 capsule (100 mg total) by mouth 2 (two) times daily. One po bid x 7 days 09/24/17   Molpus, John, MD  naproxen (NAPROSYN) 500 MG tablet Take 1 tablet (500 mg total) by mouth 2 (two) times daily as needed. 09/01/18   Blane Ohara, MD  oxycodone-acetaminophen (PERCOCET) 2.5-325 MG tablet Take 1 tablet by mouth every 4 (four) hours as needed for pain.    [provider]    Family History No family history on file.  Social History Social History   Tobacco Use  . Smoking status: Never Smoker  . Smokeless tobacco: Never Used  Substance Use Topics  . Alcohol use: Yes    Comment: occ  . Drug use: No     Allergies   Patient has no known  allergies.   Review of Systems Review of Systems  Constitutional: Negative for fever.  Musculoskeletal: Positive for gait problem and joint swelling.  Neurological: Negative for weakness and numbness.     Physical Exam Updated Vital Signs BP 122/78   Pulse 96   Temp 97.8 F (36.6 C) (Oral)   Resp 16   Ht 5\' 6"  (1.676 m)   Wt 73.5 kg   LMP 08/06/2018   SpO2 99%   BMI 26.15 kg/m   Physical Exam Vitals signs and nursing note reviewed.  Constitutional:      Appearance: She is well-developed.  HENT:     Head: Normocephalic and atraumatic.  Eyes:     General:        Right eye: No discharge.        Left eye: No discharge.  Neck:     Musculoskeletal: Normal range of motion.     Trachea: No tracheal deviation.  Cardiovascular:     Rate and Rhythm: Normal rate.  Pulmonary:     Effort: Pulmonary effort is normal.  Abdominal:     General: There is no distension.  Musculoskeletal:        General: Swelling, tenderness and signs of injury present.     Comments: Patient has tenderness to palpation of medial  joint line left knee with flexion approximately 20 degrees.  Any attempt of straightening the left knee/leg causes significant discomfort.  Soft compartment surrounding, neurovascularly intact distal left injury  Skin:    General: Skin is warm.     Findings: No rash.  Neurological:     Mental Status: She is alert and oriented to person, place, and time.      ED Treatments / Results  Labs (all labs ordered are listed, but only abnormal results are displayed) Labs Reviewed - No data to display  EKG None  Radiology Dg Knee Complete 4 Views Left  Result Date: 09/01/2018 CLINICAL DATA:  Left knee pain the following exercise, initial encounter EXAM: LEFT KNEE - COMPLETE 4+ VIEW COMPARISON:  None. FINDINGS: No evidence of fracture, dislocation, or joint effusion. No evidence of arthropathy or other focal bone abnormality. Soft tissues are unremarkable. IMPRESSION: No  acute abnormality noted. Electronically Signed   By: Alcide Clever M.D.   On: 09/01/2018 10:45    Procedures Procedures (including critical care time)  Medications Ordered in ED Medications  ketorolac (TORADOL) injection 60 mg (60 mg Intramuscular Given 09/01/18 1045)     Initial Impression / Assessment and Plan / ED Course  I have reviewed the triage vital signs and the nursing notes.  Pertinent labs & imaging results that were available during my care of the patient were reviewed by me and considered in my medical decision making (see chart for details).       Patient presents with left knee injury and decreased range of motion.  Concern for possible meniscal versus other ligamental injury on the left side.  Plan for screening x-ray, crutches, pain meds and follow-up with orthopedics.  X-ray reviewed no acute fracture.  Patient stable for follow-up with orthopedics.  Left knee immobilizer and crutches given.  Final Clinical Impressions(s) / ED Diagnoses   Final diagnoses:  Knee strain, left, initial encounter    ED Discharge Orders         Ordered    naproxen (NAPROSYN) 500 MG tablet  2 times daily PRN     09/01/18 1104           Blane Ohara, MD 09/01/18 1106

## 2018-09-01 NOTE — Discharge Instructions (Addendum)
Use tylenol and naproxen as needed every 6 hours for pain.  Elevate and use ice every 2-3 hours while awake. Follow-up with sports medicine/orthopedics for further evaluation and treatment options.

## 2018-09-05 ENCOUNTER — Other Ambulatory Visit: Payer: Self-pay

## 2018-09-05 ENCOUNTER — Ambulatory Visit (INDEPENDENT_AMBULATORY_CARE_PROVIDER_SITE_OTHER): Payer: 59 | Admitting: Family Medicine

## 2018-09-05 ENCOUNTER — Encounter: Payer: Self-pay | Admitting: Family Medicine

## 2018-09-05 VITALS — BP 103/72 | HR 91 | Ht 66.0 in | Wt 160.0 lb

## 2018-09-05 DIAGNOSIS — M25562 Pain in left knee: Secondary | ICD-10-CM | POA: Diagnosis not present

## 2018-09-05 NOTE — Progress Notes (Addendum)
PCP: System, Pcp Not In  Subjective:   HPI: Patient is a 28 y.o. female here for left knee pain.  Patient reports 2-8/10 left knee pain x5 days.  Patient states she was exercising doing sprints which was then followed by doing crunches/ab workout.  She stood up from laying on the floor and felt a pop at the medial left knee.  She reports feeling immediate pain and the sensation that her knee was locked, and she was unable to fully extend the knee.  The following day she presented to the emergency department.  X-rays performed she was discharged with crutches, knee immobilizer, and naproxen.  At this time she continues to be unable to fully extend her knee.  She also has pain with attempted weightbearing.  She does report having swelling initially but now is significantly decreased with ice and elevation.  She denies any redness, bruising, or numbness and tingling.  No associated skin changes.  No prior injuries to her knee.  No past medical history on file.  Current Outpatient Medications on File Prior to Visit  Medication Sig Dispense Refill  . amoxicillin (AMOXIL) 500 MG tablet Take 500 mg by mouth 2 (two) times daily.    Marland Kitchen doxycycline (VIBRAMYCIN) 100 MG capsule Take 1 capsule (100 mg total) by mouth 2 (two) times daily. One po bid x 7 days 14 capsule 0  . naproxen (NAPROSYN) 500 MG tablet Take 1 tablet (500 mg total) by mouth 2 (two) times daily as needed. 20 tablet 0  . oxycodone-acetaminophen (PERCOCET) 2.5-325 MG tablet Take 1 tablet by mouth every 4 (four) hours as needed for pain.     No current facility-administered medications on file prior to visit.     Past Surgical History:  Procedure Laterality Date  . LIPOSUCTION    . LIPOSUCTION W/ FAT INJECTION  10/28/2017    No Known Allergies  Social History   Socioeconomic History  . Marital status: Single    Spouse name: Not on file  . Number of children: Not on file  . Years of education: Not on file  . Highest education  level: Not on file  Occupational History  . Not on file  Social Needs  . Financial resource strain: Not on file  . Food insecurity:    Worry: Not on file    Inability: Not on file  . Transportation needs:    Medical: Not on file    Non-medical: Not on file  Tobacco Use  . Smoking status: Never Smoker  . Smokeless tobacco: Never Used  Substance and Sexual Activity  . Alcohol use: Yes    Comment: occ  . Drug use: No  . Sexual activity: Not on file  Lifestyle  . Physical activity:    Days per week: Not on file    Minutes per session: Not on file  . Stress: Not on file  Relationships  . Social connections:    Talks on phone: Not on file    Gets together: Not on file    Attends religious service: Not on file    Active member of club or organization: Not on file    Attends meetings of clubs or organizations: Not on file    Relationship status: Not on file  . Intimate partner violence:    Fear of current or ex partner: Not on file    Emotionally abused: Not on file    Physically abused: Not on file    Forced sexual activity: Not on  file  Other Topics Concern  . Not on file  Social History Narrative  . Not on file    No family history on file.  BP 103/72   Pulse 91   Ht 5\' 6"  (1.676 m)   Wt 160 lb (72.6 kg)   LMP 08/06/2018   BMI 25.82 kg/m   Review of Systems: See HPI above.     Objective:  Physical Exam:  Gen: awake, alert, NAD, comfortable in exam room Pulm: breathing unlabored  Right knee: - Inspection: No gross deformity.  Mild effusion. No erythema or bruising. Skin intact - Palpation: She is focally tender over the medial joint line - ROM: Full flexion of the knee.  She is limited in extension by approximately 15 degrees - Strength: Limited due to patient's pain - Neuro/vasc: NV intact - Special Tests: - LIGAMENTS: negative anterior/posterior drawer, negative Lachman's, no MCL or LCL laxity  -- MENISCUS: Pain with McMurray's  -- PF JOINT: nml  patellar mobility without apprehension.  Left Knee: - Inspection: No gross deformity. No effusion. No erythema or bruising. Skin intact - Palpation: no TTP - ROM: full active ROM with flexion and extension in knee - Strength: 5/5 strength - Neuro/vasc: NV intact - Special Tests: - LIGAMENTS:  negative Lachman's, no MCL or LCL laxity    Assessment & Plan:  1.  Acute right knee pain-exam concerning for meniscal tear - MRI of right knee - due to locking/mechanical injury recommend surgery referral if meniscal tear present - f/u after MRI - ibuprofen or aleve, icing, elevation  Addendum:  MRI reviewed and discussed with patient.  She does have a vertical medial meniscus tear of both anterior and posterior horns.  She reports motion has improved and only lacks a few degrees of extension now and pain has resolved - she sent photos (see patient email for more details).

## 2018-09-05 NOTE — Patient Instructions (Addendum)
I'm concerned you have a bucket handle medial meniscus tear. We will go ahead with MRI to further assess. ACE wrap or knee brace for stability and to help with swelling. Crutches to help get around. Icing 15 minutes at a time 3-4 times a day at least. Elevate above your heart as much as possible. Ibuprofen 600mg  three times a day with food OR aleve 2 tabs twice a day with food for pain and inflammation. Next steps will depend on MRI results.

## 2018-09-26 ENCOUNTER — Encounter: Payer: Self-pay | Admitting: Family Medicine

## 2018-11-30 DIAGNOSIS — Z8759 Personal history of other complications of pregnancy, childbirth and the puerperium: Secondary | ICD-10-CM | POA: Insufficient documentation

## 2019-05-09 DIAGNOSIS — Z862 Personal history of diseases of the blood and blood-forming organs and certain disorders involving the immune mechanism: Secondary | ICD-10-CM | POA: Insufficient documentation

## 2020-05-17 ENCOUNTER — Other Ambulatory Visit: Payer: Self-pay

## 2020-05-17 ENCOUNTER — Encounter (HOSPITAL_BASED_OUTPATIENT_CLINIC_OR_DEPARTMENT_OTHER): Payer: Self-pay | Admitting: Emergency Medicine

## 2020-05-17 ENCOUNTER — Emergency Department (HOSPITAL_BASED_OUTPATIENT_CLINIC_OR_DEPARTMENT_OTHER)
Admission: EM | Admit: 2020-05-17 | Discharge: 2020-05-18 | Disposition: A | Discharge status: Home / Self Care | Payer: Medicaid Other | Attending: Emergency Medicine | Admitting: Emergency Medicine

## 2020-05-17 DIAGNOSIS — M25562 Pain in left knee: Secondary | ICD-10-CM | POA: Insufficient documentation

## 2020-05-17 MED ORDER — HYDROCODONE-ACETAMINOPHEN 5-325 MG PO TABS
1.0000 | ORAL_TABLET | Freq: Once | ORAL | Status: AC
  Administered 2020-05-17: 1 via ORAL
  Filled 2020-05-17: qty 1

## 2020-05-17 NOTE — ED Notes (Signed)
ED Provider at bedside. 

## 2020-05-17 NOTE — ED Provider Notes (Signed)
[facility] EMERGENCY DEPARTMENT Provider Note   CSN: [id number] Arrival date & time: 05/17/20  1937     History Chief Complaint  Patient presents with  . Knee Pain    Deborah Rodgers is a 29 y.o. female patient reports that she has a history of meniscal problems of the left knee.  She reports that around 2 hours prior to arrival she was sitting on her knees on her bed.  Her boyfriend then gave her a hug and she fell backwards over flexing her left knee.  She reports that when she attempted to then straighten her leg she is having difficulty straightening it all the way and was having severe knee pain and cannot place weight on the leg, she reports sharp pain constant nonradiating improved with keeping the knee slightly flexed.  Denies head injury, loss conscious, neck pain, back pain, chest pain, abdominal pain, numbness/tingling, weakness, hip pain, ankle pain or any additional concerns. HPI     History reviewed. No pertinent past medical history.  Patient Active Problem List   Diagnosis Date Noted  . Chronic idiopathic constipation 03/05/2014    Past Surgical History:  Procedure Laterality Date  . LIPOSUCTION    . LIPOSUCTION W/ FAT INJECTION  10/28/2017     OB History   No obstetric history on file.     History reviewed. No pertinent family history.  Social History   Tobacco Use  . Smoking status: Never Smoker  . Smokeless tobacco: Never Used  Substance Use Topics  . Alcohol use: Yes    Comment: occ  . Drug use: No    Home Medications Prior to Admission medications   Medication Sig Start Date End Date Taking? Authorizing Provider  amoxicillin (AMOXIL) 500 MG tablet Take 500 mg by mouth 2 (two) times daily.    [provider]  doxycycline (VIBRAMYCIN) 100 MG capsule Take 1 capsule (100 mg total) by mouth 2 (two) times daily. One po bid x 7 days 09/24/17   Molpus, John, MD  naproxen (NAPROSYN) 500 MG tablet Take 1 tablet (500 mg total) by  mouth 2 (two) times daily as needed. 09/01/18   Blane Ohara, MD    Allergies    Patient has no known allergies.  Review of Systems   Review of Systems  Constitutional: Negative.  Negative for chills and fever.  Cardiovascular: Negative.  Negative for chest pain.  Gastrointestinal: Negative.  Negative for abdominal pain.  Musculoskeletal: Positive for arthralgias (Left Knee). Negative for back pain and neck pain.  Skin: Negative.  Negative for color change and wound.  Neurological: Negative.  Negative for weakness, numbness and headaches.    Physical Exam Updated Vital Signs BP (!) 132/96 (BP Location: Right Arm)   Pulse (!) 108   Temp 99.1 F (37.3 C) (Oral)   Resp 16   Ht 5\' 6"  (1.676 m)   Wt 79.8 kg   LMP 04/17/2020 (Approximate)   SpO2 100%   BMI 28.41 kg/m   Physical Exam Constitutional:      General: She is not in acute distress.    Appearance: Normal appearance. She is well-developed. She is not ill-appearing or diaphoretic.  HENT:     Head: Normocephalic and atraumatic.  Eyes:     General: Vision grossly intact. Gaze aligned appropriately.     Pupils: Pupils are equal, round, and reactive to light.  Neck:     Trachea: Trachea and phonation normal.  Cardiovascular:     Pulses:  Dorsalis pedis pulses are 2+ on the right side and 2+ on the left side.  Pulmonary:     Effort: Pulmonary effort is normal. No respiratory distress.  Abdominal:     General: There is no distension.     Palpations: Abdomen is soft.     Tenderness: There is no abdominal tenderness. There is no guarding or rebound.  Musculoskeletal:        General: Normal range of motion.     Cervical back: Normal range of motion.     Comments: Left Knee:   Appearance normal. No obvious deformity. No skin swelling, erythema, heat, fluctuance or break of the skin.  Tenderness to palpation over medial joint line.  Patient keeps leg flexed around 45 degrees, she is able to flex past that with  minimal pain but cannot extend it due to pain. Negative anterior/poster drawer bilaterally. Negative ballottement test. No varus or valgus laxity or locking. No tenderness to palpation of hips or ankles.Compartments soft. Neurovascularly intact distally to site of injury.  Feet:     Right foot:     Protective Sensation: 5 sites tested. 5 sites sensed.     Left foot:     Protective Sensation: 5 sites tested. 5 sites sensed.  Skin:    General: Skin is warm and dry.  Neurological:     Mental Status: She is alert.     GCS: GCS eye subscore is 4. GCS verbal subscore is 5. GCS motor subscore is 6.     Comments: Speech is clear and goal oriented, follows commands Major Cranial nerves without deficit, no facial droop Moves extremities without ataxia, coordination intact  Psychiatric:        Behavior: Behavior normal.     ED Results / Procedures / Treatments   Labs (all labs ordered are listed, but only abnormal results are displayed) Labs Reviewed - No data to display  EKG None  Radiology DG Knee Complete 4 Views Left  Result Date: 05/18/2020 CLINICAL DATA:  Pain with extension after new injury today. Unable to extend the knee. Chronic meniscus injury for 2 years. EXAM: LEFT KNEE - COMPLETE 4+ VIEW COMPARISON:  09/01/2018 FINDINGS: No evidence of fracture, dislocation, or joint effusion. No evidence of arthropathy or other focal bone abnormality. Soft tissues are unremarkable. IMPRESSION: Negative. Electronically Signed   By: Burman Nieves M.D.   On: 05/18/2020 00:25    Procedures Procedures (including critical care time)  Medications Ordered in ED Medications  HYDROcodone-acetaminophen (NORCO/VICODIN) 5-325 MG per tablet 1 tablet (1 tablet Oral Given 05/17/20 2350)    ED Course  I have reviewed the triage vital signs and the nursing notes.  Pertinent labs & imaging results that were available during my care of the patient were reviewed by me and considered in my medical  decision making (see chart for details).    MDM Rules/Calculators/A&P                         Additional history obtained from: 1. Nursing notes from this visit. -------------------------------- 29 year old female presents for left knee pain after she fell backwards on her bed while sitting on her knees causing possible extensive flexion at the knee.  She did not have any pain initially but when she attempted to straighten her leg fully she noticed some pain.  She is tenderness along the medial joint line of the left knee and pain with full extension, she is concerned for exacerbation of  a previous meniscal injury.  She is neurovascular intact extremity pedal pulses good capillary refill and sensation in all toes.  No pain with movement at the hip ankle foot or toes.  Compartments are soft.  No evidence for septic arthritis, DVT, compartment syndrome, cellulitis, neurovascular compromise or other emergent pathologies.   DG Left Knee: IMPRESSION:  Negative.    Patient was placed in knee brace given crutches, concern for possible meniscal injury among other soft tissue injuries of the knee, rice therapy and OTC anti-inflammatories discussed.  She was given 1 Norco in the ER for her pain, her boyfriend will drive home today.  She denies any chance of pregnancy citing that she has an IUD recently placed and did not want pregnancy test today.  Narcotic precautions discussed with patient.  At this time there does not appear to be any evidence of an acute emergency medical condition and the patient appears stable for discharge with appropriate outpatient follow up. Diagnosis was discussed with patient who verbalizes understanding of care plan and is agreeable to discharge. I have discussed return precautions with patient and boyfriend who verbalizes understanding. Patient encouraged to follow-up with their PCP and Dr Roda Shutters. All questions answered.  Patient's case discussed with Dr. Preston Fleeting who agrees with  plan to discharge with follow-up.   Note: Portions of this report may have been transcribed using voice recognition software. Every effort was made to ensure accuracy; however, inadvertent computerized transcription errors may still be present. Final Clinical Impression(s) / ED Diagnoses Final diagnoses:  Acute pain of left knee    Rx / DC Orders ED Discharge Orders    None       Elizabeth Palau 05/18/20 0032    Dione Booze, MD 05/18/20 [phone]

## 2020-05-17 NOTE — ED Triage Notes (Signed)
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 Reports today re injuring knee and unable to extend knee fully.

## 2020-05-17 NOTE — ED Notes (Signed)
When lying still, left knee pain is a 2 on 0-10 scale, when attempting to move left leg or walk, pain increases to a 10 on 0-10 scale.

## 2020-05-17 NOTE — ED Notes (Signed)
Pt instructed not to get up without staff in room due to medication rec and due to left knee injury

## 2020-05-17 NOTE — ED Notes (Signed)
Injury to left knee 2 years ago. Today was playing on the bed, lost balance, feels like she over bent her left knee, then realized she could not walk, place weight or extend her left knee. Has severe pain. Placed on stretcher, positioned for comfort

## 2020-05-18 ENCOUNTER — Emergency Department (HOSPITAL_BASED_OUTPATIENT_CLINIC_OR_DEPARTMENT_OTHER): Payer: Medicaid Other

## 2020-05-18 NOTE — Discharge Instructions (Addendum)
At this time there does not appear to be the presence of an emergent medical condition, however there is always the potential for conditions to change. Please read and follow the below instructions.  Please return to the Emergency Department immediately for any new or worsening symptoms. Please be sure to follow up with your Primary Care Provider within one week regarding your visit today; please call their office to schedule an appointment even if you are feeling better for a follow-up visit. Use the brace and crutches to protect your knee from further injury.  Please call the orthopedist Dr. Roda Shutters on your discharge paperwork to schedule a follow-up appointment.  Go to the nearest Emergency Department immediately if: You have fever or chills Your knee swells, and the swelling gets worse. You cannot move your knee. You have very bad knee pain. You have any new/concerning or worsening of symptoms  Please read the additional information packets attached to your discharge summary.  Do not take your medicine if  develop an itchy rash, swelling in your mouth or lips, or difficulty breathing; call 911 and seek immediate emergency medical attention if this occurs.  You may review your lab tests and imaging results in their entirety on your MyChart account.  Please discuss all results of fully with your primary care provider and other specialist at your follow-up visit.  Note: Portions of this text may have been transcribed using voice recognition software. Every effort was made to ensure accuracy; however, inadvertent computerized transcription errors may still be present.

## 2020-05-18 NOTE — ED Notes (Signed)
LLE knee immobilizer applied per ED PA orders, pt states knee feels much better with immobilizer on, pt teaching on application of knee immobilizer done, opportunity for questions provided, also ice pack provided, again pt teaching done on ice application for pain control. Discussion on RICE technique was also implemented. Teach Back method utilized. Pt was able to use crutches properly as well. AVS reviewed with client, copy of AVS given to pt as well. Significant other with pt to drive her home.

## 2020-05-22 ENCOUNTER — Encounter: Payer: Self-pay | Admitting: Family Medicine

## 2020-05-22 ENCOUNTER — Ambulatory Visit: Payer: Self-pay

## 2020-05-22 ENCOUNTER — Ambulatory Visit (INDEPENDENT_AMBULATORY_CARE_PROVIDER_SITE_OTHER): Payer: Medicaid Other | Admitting: Family Medicine

## 2020-05-22 ENCOUNTER — Other Ambulatory Visit: Payer: Self-pay

## 2020-05-22 VITALS — BP 142/78 | HR 90 | Ht 66.0 in | Wt 175.0 lb

## 2020-05-22 DIAGNOSIS — M25562 Pain in left knee: Secondary | ICD-10-CM

## 2020-05-22 DIAGNOSIS — S83242A Other tear of medial meniscus, current injury, left knee, initial encounter: Secondary | ICD-10-CM

## 2020-05-22 DIAGNOSIS — S83219A Bucket-handle tear of medial meniscus, current injury, unspecified knee, initial encounter: Secondary | ICD-10-CM | POA: Insufficient documentation

## 2020-05-22 MED ORDER — PREDNISONE 5 MG PO TABS: ORAL_TABLET | ORAL | 0 refills | Status: DC

## 2020-05-22 NOTE — Patient Instructions (Signed)
Nice to meet you Please try ice  Please try range of motion movements.  Please continue the crutches and try the prednisone   Please send me a message in MyChart with any questions or updates.  Please see me back in 2 weeks.   --Dr. Jordan Likes

## 2020-05-22 NOTE — Progress Notes (Signed)
Deborah Rodgers - 29 y.o. female MRN [id number]  Date of birth: 04-10-1991  SUBJECTIVE:  Including CC & ROS.  Chief Complaint  Patient presents with  . Knee Pain    left    Deborah Rodgers is a 29 y.o. female that is presenting with acute left knee pain.  The pain has been ongoing since last week.  She had an incident where the pain originated from.  She initially had swelling but it has subsided somewhat.  She is still using crutches.  It feels similar to the previous pain she had a few years ago.  No previous surgery..  Independent review of left knee x-ray from 11/27 shows no acute abnormalities.   Review of Systems See HPI   HISTORY: Past Medical, Surgical, Social, and Family History Reviewed & Updated per EMR.   Pertinent Historical Findings include:  No past medical history on file.  Past Surgical History:  Procedure Laterality Date  . LIPOSUCTION    . LIPOSUCTION W/ FAT INJECTION  10/28/2017    No family history on file.  Social History   Socioeconomic History  . Marital status: Single    Spouse name: Not on file  . Number of children: Not on file  . Years of education: Not on file  . Highest education level: Not on file  Occupational History  . Not on file  Tobacco Use  . Smoking status: Never Smoker  . Smokeless tobacco: Never Used  Substance and Sexual Activity  . Alcohol use: Yes    Comment: occ  . Drug use: No  . Sexual activity: Not on file  Other Topics Concern  . Not on file  Social History Narrative  . Not on file   Social Determinants of Health   Financial Resource Strain:   . Difficulty of Paying Living Expenses: Not on file  Food Insecurity:   . Worried About Programme researcher, broadcasting/film/video in the Last Year: Not on file  . Ran Out of Food in the Last Year: Not on file  Transportation Needs:   . Lack of Transportation (Medical): Not on file  . Lack of Transportation (Non-Medical): Not on file  Physical Activity:   . Days of Exercise per Week: Not  on file  . Minutes of Exercise per Session: Not on file  Stress:   . Feeling of Stress : Not on file  Social Connections:   . Frequency of Communication with Friends and Family: Not on file  . Frequency of Social Gatherings with Friends and Family: Not on file  . Attends Religious Services: Not on file  . Active Member of Clubs or Organizations: Not on file  . Attends Banker Meetings: Not on file  . Marital Status: Not on file  Intimate Partner Violence:   . Fear of Current or Ex-Partner: Not on file  . Emotionally Abused: Not on file  . Physically Abused: Not on file  . Sexually Abused: Not on file     PHYSICAL EXAM:  VS: BP (!) 142/78   Pulse 90   Ht 5\' 6"  (1.676 m)   Wt 175 lb (79.4 kg)   BMI 28.25 kg/m  Physical Exam Gen: NAD, alert, cooperative with exam, well-appearing MSK:  Left knee: Effusion noted. Limited extension and flexion. Tenderness palpation of the medial meniscus. Neurovascular intact  Limited ultrasound: Left knee:  Mild to moderate effusion. Normal-appearing quadricep patellar tendon. Normal-appearing lateral meniscus and joint space. Normal joint space medially with significant hyperemia over the  medial meniscus to suspect tear.  Summary: Findings would suggest an acute meniscal tear.  Ultrasound and interpretation by Clare Gandy, MD    ASSESSMENT & PLAN:   Acute medial meniscus tear of left knee Injury occurred on 11/26.  She has a similar pain from a few years ago.  She is having mechanical symptoms on exam today. -Counseled on home exercise therapy and supportive care. -Continue crutches. -Hinged knee brace. -Prednisone. -May need to consider further imaging or physical therapy.

## 2020-05-22 NOTE — Assessment & Plan Note (Signed)
Injury occurred on 11/26.  She has a similar pain from a few years ago.  She is having mechanical symptoms on exam today. -Counseled on home exercise therapy and supportive care. -Continue crutches. -Hinged knee brace. -Prednisone. -May need to consider further imaging or physical therapy.

## 2020-05-28 ENCOUNTER — Ambulatory Visit: Payer: 59 | Admitting: Orthopaedic Surgery

## 2020-06-03 ENCOUNTER — Ambulatory Visit (INDEPENDENT_AMBULATORY_CARE_PROVIDER_SITE_OTHER): Payer: Medicaid Other | Admitting: Family Medicine

## 2020-06-03 ENCOUNTER — Encounter: Payer: Self-pay | Admitting: Family Medicine

## 2020-06-03 ENCOUNTER — Other Ambulatory Visit: Payer: Self-pay

## 2020-06-03 DIAGNOSIS — S83242D Other tear of medial meniscus, current injury, left knee, subsequent encounter: Secondary | ICD-10-CM

## 2020-06-03 NOTE — Patient Instructions (Signed)
Good to see you Please continue the brace  Please use ice as needed  Please continue range of motion   Please send me a message in MyChart with any questions or updates.  We will call to setup a virtual visit once the MRI is resulted.   --Dr. Jordan Likes

## 2020-06-03 NOTE — Progress Notes (Signed)
  Deborah Rodgers - 29 y.o. female MRN [id number]  Date of birth: [birth year]  SUBJECTIVE:  Including CC & ROS.  Chief Complaint  Patient presents with  . Follow-up    Left knee    Deborah Rodgers is a 29 y.o. female that is presenting with ongoing left knee pain.  She is still using the brace and having mechanical symptoms.  She can walk without crutches but still experiences pain intermittently.   Review of Systems See HPI   HISTORY: Past Medical, Surgical, Social, and Family History Reviewed & Updated per EMR.   Pertinent Historical Findings include:  No past medical history on file.  Past Surgical History:  Procedure Laterality Date  . LIPOSUCTION    . LIPOSUCTION W/ FAT INJECTION  10/28/2017    No family history on file.  Social History   Socioeconomic History  . Marital status: Single    Spouse name: Not on file  . Number of children: Not on file  . Years of education: Not on file  . Highest education level: Not on file  Occupational History  . Not on file  Tobacco Use  . Smoking status: Never Smoker  . Smokeless tobacco: Never Used  Substance and Sexual Activity  . Alcohol use: Yes    Comment: occ  . Drug use: No  . Sexual activity: Not on file  Other Topics Concern  . Not on file  Social History Narrative  . Not on file   Social Determinants of Health   Financial Resource Strain: Not on file  Food Insecurity: Not on file  Transportation Needs: Not on file  Physical Activity: Not on file  Stress: Not on file  Social Connections: Not on file  Intimate Partner Violence: Not on file     PHYSICAL EXAM:  VS: BP 127/85   Pulse (!) 109   Ht 5\' 6"  (1.676 m)   Wt 175 lb (79.4 kg)   BMI 28.25 kg/m  Physical Exam Gen: NAD, alert, cooperative with exam, well-appearing MSK:  Left knee: Effusion noted. Lacks full extension and flexion. Positive McMurray's test. Neurovascular intact     ASSESSMENT & PLAN:   Acute medial meniscus tear of left  knee Still presenting with mechanical sytmpoms.  Exam would indicate ongoing meniscal tear.  Seems to be interfering with her joint mobility. -Counseled on home exercise therapy and supportive care. -MRI to evaluate for internal derangement. -Could consider physical therapy

## 2020-06-03 NOTE — Assessment & Plan Note (Addendum)
Still presenting with mechanical sytmpoms.  Exam would indicate ongoing meniscal tear.  Seems to be interfering with her joint mobility. -Counseled on home exercise therapy and supportive care. -MRI to evaluate for internal derangement. -Could consider physical therapy

## 2020-06-05 ENCOUNTER — Telehealth: Payer: Self-pay | Admitting: Family Medicine

## 2020-06-05 NOTE — Telephone Encounter (Signed)
Faxed UHC/Medicaid pre-auth form to [facility] @ [phone] for authorization of MRI  FYI  -glh

## 2020-06-25 ENCOUNTER — Encounter: Payer: Self-pay | Admitting: Family Medicine

## 2020-07-29 ENCOUNTER — Encounter: Payer: Self-pay | Admitting: Family Medicine

## 2020-08-24 ENCOUNTER — Other Ambulatory Visit: Payer: Self-pay

## 2020-08-24 ENCOUNTER — Ambulatory Visit (INDEPENDENT_AMBULATORY_CARE_PROVIDER_SITE_OTHER): Payer: Medicaid Other

## 2020-08-24 DIAGNOSIS — S83242D Other tear of medial meniscus, current injury, left knee, subsequent encounter: Secondary | ICD-10-CM

## 2020-08-27 ENCOUNTER — Other Ambulatory Visit: Payer: Self-pay

## 2020-08-27 ENCOUNTER — Telehealth (INDEPENDENT_AMBULATORY_CARE_PROVIDER_SITE_OTHER): Payer: Medicaid Other | Admitting: Family Medicine

## 2020-08-27 DIAGNOSIS — S83212D Bucket-handle tear of medial meniscus, current injury, left knee, subsequent encounter: Secondary | ICD-10-CM | POA: Diagnosis not present

## 2020-08-27 NOTE — Assessment & Plan Note (Signed)
MRI was revealing for a bucket-handle tear and she is still having mechanical issues. -Counseled on home exercise therapy and supportive care. -Referral to orthopedic surgery.

## 2020-08-27 NOTE — Progress Notes (Signed)
Virtual Visit via Video Note  I connected with Deborah Rodgers on 08/27/20 at  8:00 AM EST by a video enabled telemedicine application and verified that I am speaking with the correct person using two identifiers.  Location: Patient: Vehicle  Provider: office    I discussed the limitations of evaluation and management by telemedicine and the availability of in person appointments. The patient expressed understanding and agreed to proceed.  History of Present Illness:  Deborah Rodgers is a 30 year old female that is following up after the MRI of her left knee.  MRI was revealing for a bucket-handle tear.  She is still having mechanical symptoms.   Observations/Objective:  Gen: NAD, alert, cooperative with exam, well-appearing   Assessment and Plan:  Bucket-handle tear of the medial meniscus of left knee: MRI was revealing for a bucket-handle tear and she is still having mechanical issues. -Counseled on home exercise therapy and supportive care. -Referral to orthopedic surgery.  Follow Up Instructions:    I discussed the assessment and treatment plan with the patient. The patient was provided an opportunity to ask questions and all were answered. The patient agreed with the plan and demonstrated an understanding of the instructions.   The patient was advised to call back or seek an in-person evaluation if the symptoms worsen or if the condition fails to improve as anticipated.    Clare Gandy, MD

## 2020-09-09 ENCOUNTER — Encounter: Payer: Self-pay | Admitting: Family Medicine

## 2020-09-10 ENCOUNTER — Encounter: Payer: Self-pay | Admitting: Family Medicine

## 2021-01-16 IMAGING — DX DG KNEE COMPLETE 4+V*L*
4 series · 4 of 4 positions shown · non-contrast
Comparison: 09/01/2018

CLINICAL DATA: Pain with extension after new injury today. Unable
to extend the knee. Chronic meniscus injury for 2 years.

EXAM:
LEFT KNEE - COMPLETE 4+ VIEW

[knee ap]
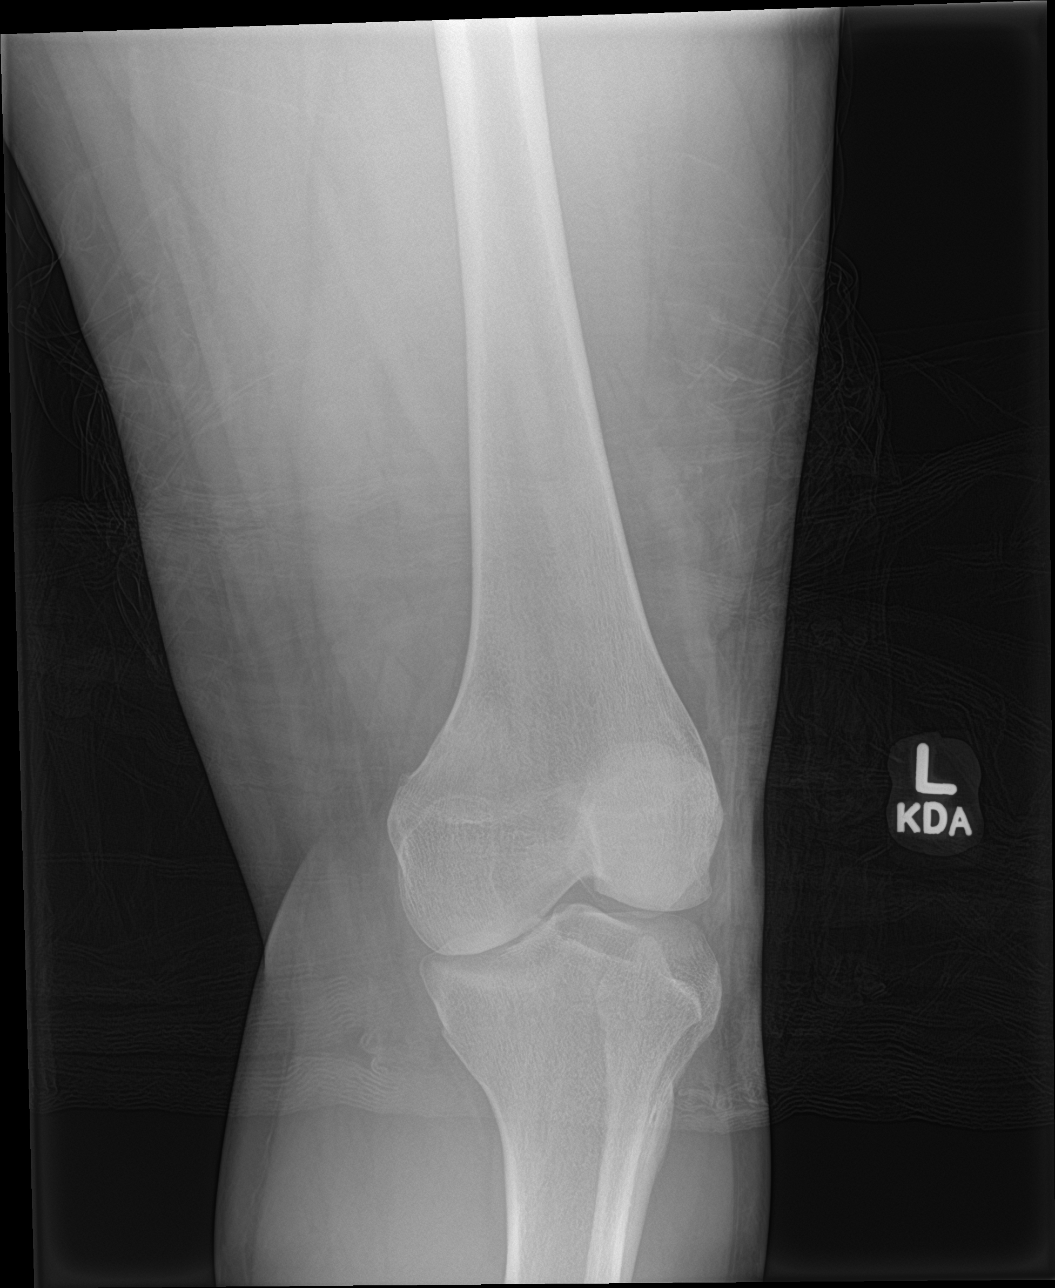

[knee lat]
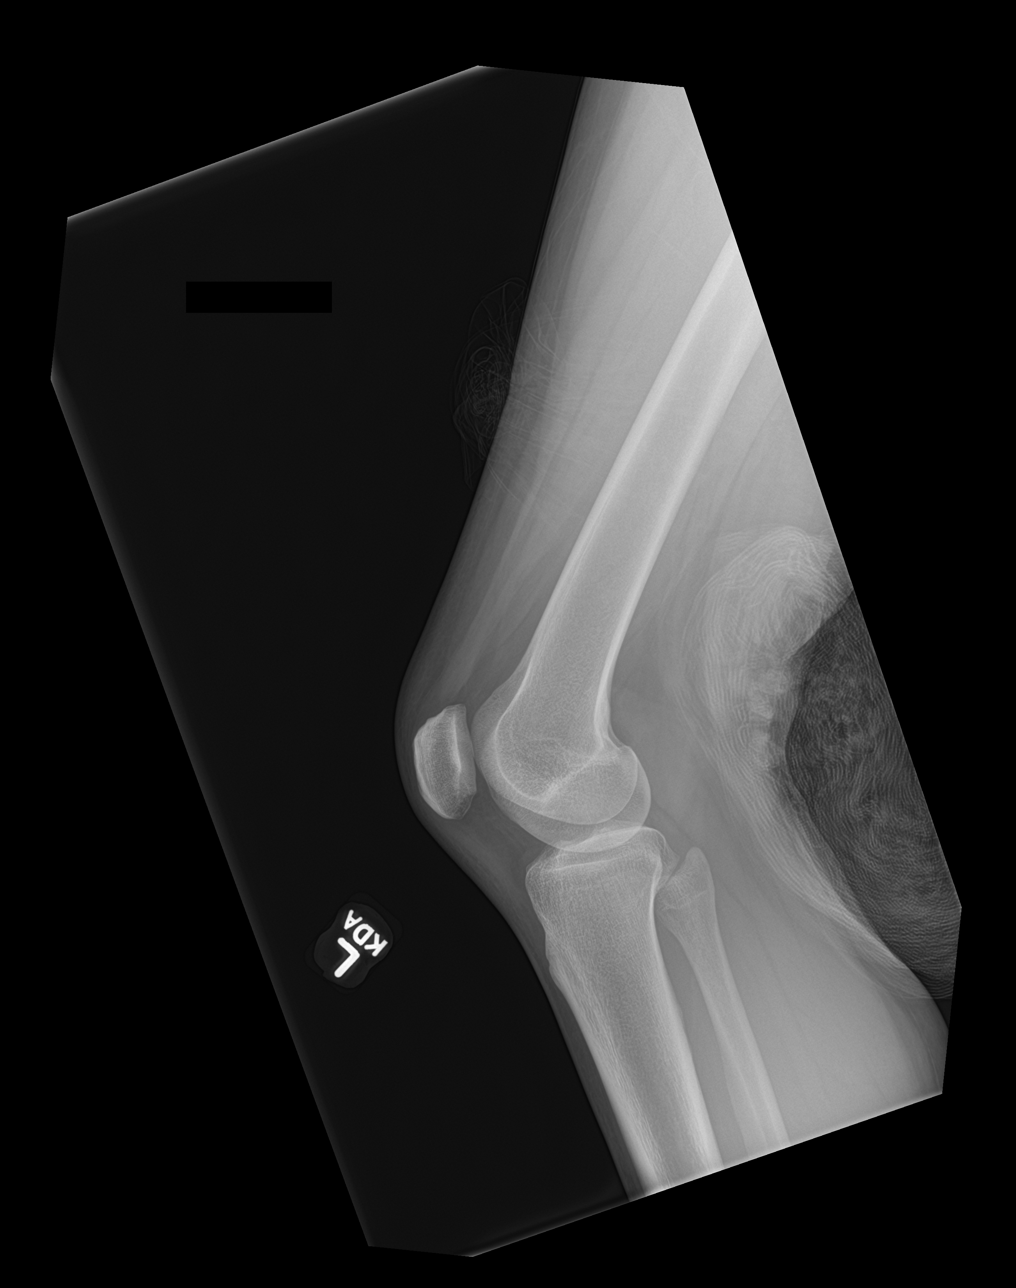

[knee obl (1 of 2)]
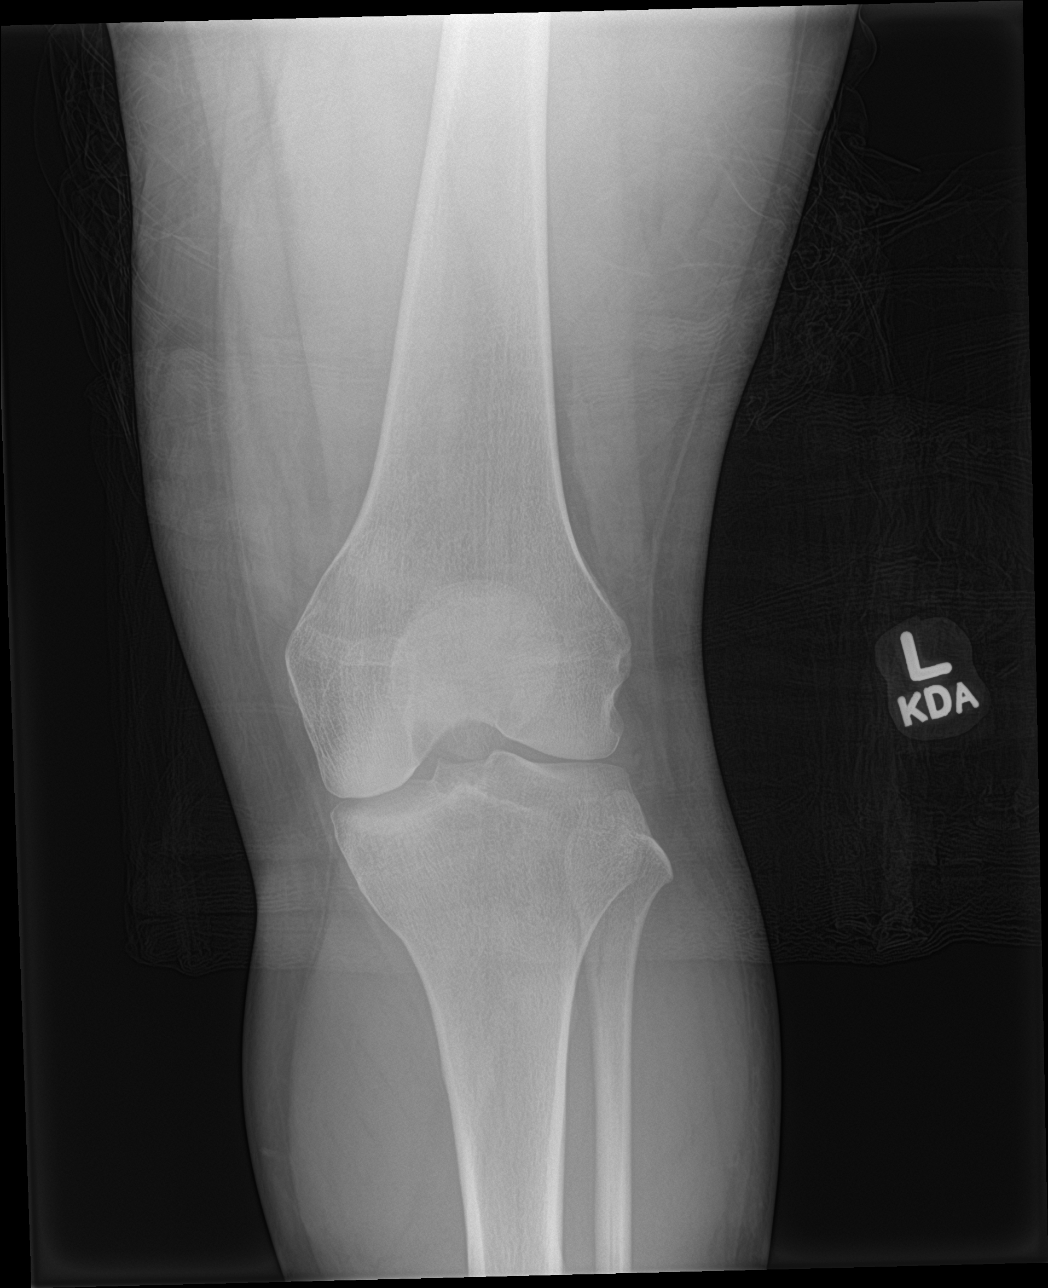

[knee obl (2 of 2)]
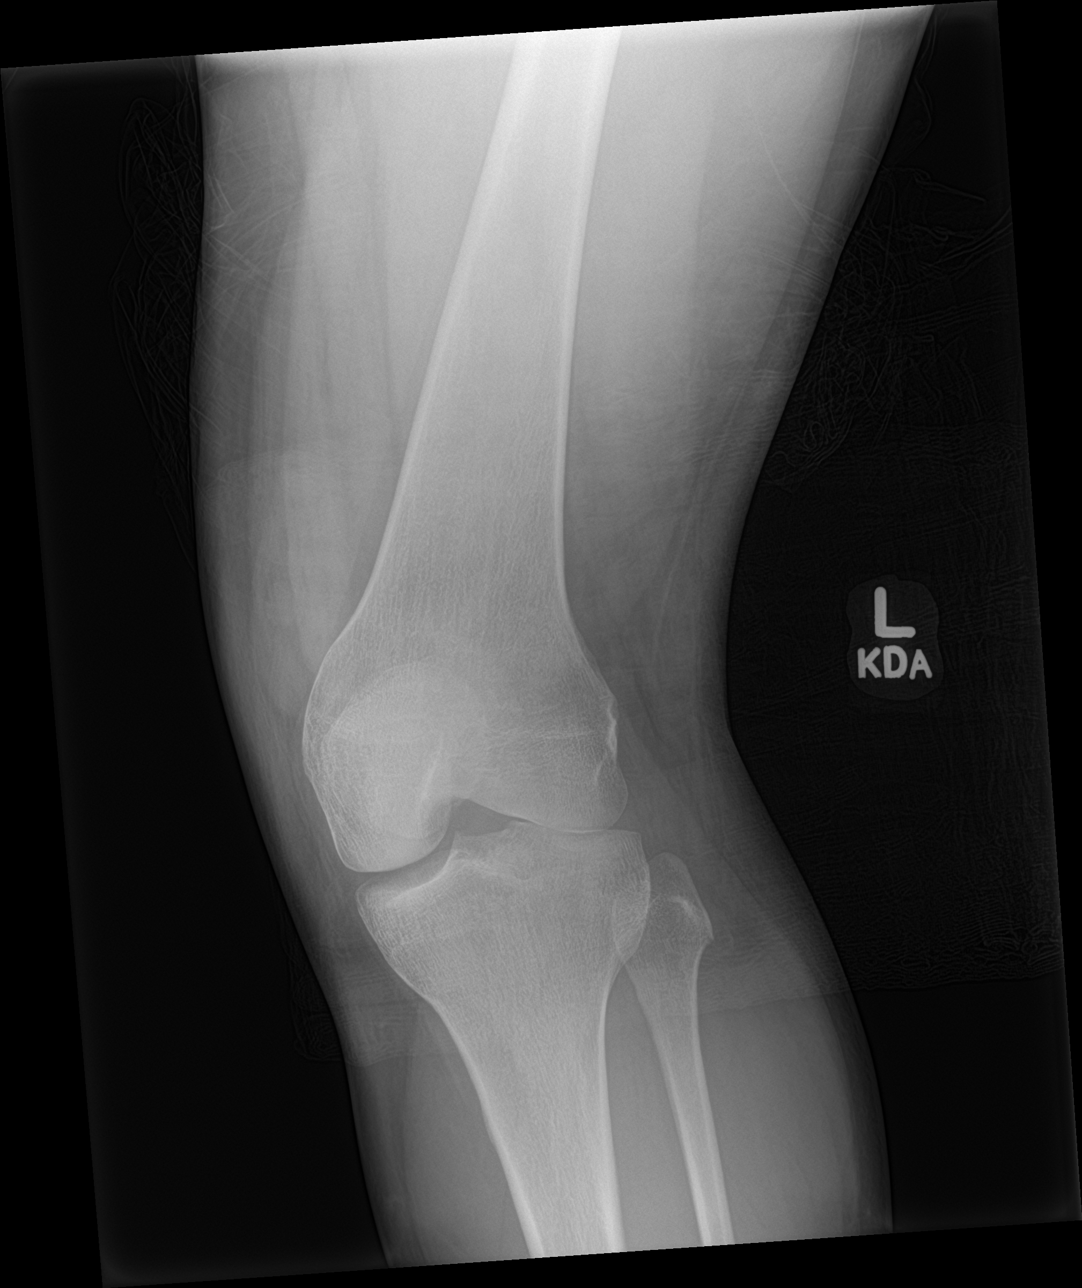

[4 of 4 positions shown; findings below may reference images not displayed]

FINDINGS: No evidence of fracture, dislocation, or joint effusion. No evidence
of arthropathy or other focal bone abnormality. Soft tissues are
unremarkable.
IMPRESSION: Negative.

## 2022-01-21 ENCOUNTER — Ambulatory Visit (INDEPENDENT_AMBULATORY_CARE_PROVIDER_SITE_OTHER): Payer: Medicaid Other | Admitting: Podiatry

## 2022-01-21 DIAGNOSIS — M79672 Pain in left foot: Secondary | ICD-10-CM

## 2022-01-21 DIAGNOSIS — M79671 Pain in right foot: Secondary | ICD-10-CM

## 2022-01-21 MED ORDER — METHYLPREDNISOLONE 4 MG PO TBPK: ORAL_TABLET | ORAL | 0 refills | Status: DC

## 2022-01-21 MED ORDER — MELOXICAM 15 MG PO TABS: 15.0000 mg | ORAL_TABLET | Freq: Every day | ORAL | 1 refills | Status: DC

## 2022-01-26 NOTE — Progress Notes (Signed)
   No chief complaint on file.   HPI: 31 y.o. female presenting today as a new patient for evaluation of bilateral foot pain this been going on for few months now.  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  She denies a history of injury.  She has not done anything for treatment.  No past medical history on file.  Past Surgical History:  Procedure Laterality Date   LIPOSUCTION     LIPOSUCTION W/ FAT INJECTION  10/28/2017    No Known Allergies   Physical Exam: General: The patient is alert and oriented x3 in no acute distress.  Dermatology: Skin is warm, dry and supple bilateral lower extremities. Negative for open lesions or macerations.  Vascular: Palpable pedal pulses bilaterally. Capillary refill within normal limits.  Negative for any significant edema or erythema  Neurological: Light touch and protective threshold grossly intact  Musculoskeletal Exam: No pedal deformities noted.  Diffuse tenderness throughout palpation of the bilateral feet and heels noted.  There is no appreciable pinpoint tenderness with any specific location.  This is generalized foot pain  Assessment: 1.  Generalized foot pain/achiness bilateral feet   Plan of Care:  1. Patient evaluated.  2.  Prescription for Medrol Dosepak 3.  Prescription for meloxicam 15 mg daily 4.  Appointment with our orthotics department for custom molded orthotics 5.  Return to clinic as needed     Felecia Shelling, DPM [facility]  Dr. Felecia Shelling, DPM    [address] [place], [place] [address]                Office [phone]  Fax [phone]

## 2022-03-10 ENCOUNTER — Telehealth: Payer: Self-pay | Admitting: Podiatry

## 2022-03-10 NOTE — Telephone Encounter (Signed)
LVM FOR OPU 

## 2022-07-01 ENCOUNTER — Other Ambulatory Visit: Payer: Self-pay

## 2022-07-01 ENCOUNTER — Emergency Department (HOSPITAL_BASED_OUTPATIENT_CLINIC_OR_DEPARTMENT_OTHER)
Admission: EM | Admit: 2022-07-01 | Discharge: 2022-07-01 | Discharge status: Against Medical Advice | Payer: 59 | Attending: Emergency Medicine | Admitting: Emergency Medicine

## 2022-07-01 ENCOUNTER — Encounter (HOSPITAL_BASED_OUTPATIENT_CLINIC_OR_DEPARTMENT_OTHER): Payer: Self-pay | Admitting: Urology

## 2022-07-01 ENCOUNTER — Emergency Department (HOSPITAL_BASED_OUTPATIENT_CLINIC_OR_DEPARTMENT_OTHER): Payer: 59

## 2022-07-01 DIAGNOSIS — Z5321 Procedure and treatment not carried out due to patient leaving prior to being seen by health care provider: Secondary | ICD-10-CM | POA: Diagnosis not present

## 2022-07-01 DIAGNOSIS — R0789 Other chest pain: Secondary | ICD-10-CM | POA: Insufficient documentation

## 2022-07-01 DIAGNOSIS — M542 Cervicalgia: Secondary | ICD-10-CM | POA: Diagnosis not present

## 2022-07-01 DIAGNOSIS — R079 Chest pain, unspecified: Secondary | ICD-10-CM | POA: Diagnosis not present

## 2022-07-01 LAB — CBC
HCT: 39.7 % (ref 36.0–46.0)
Hemoglobin: 12.9 g/dL (ref 12.0–15.0)
MCH: 25.9 pg — ABNORMAL LOW (ref 26.0–34.0)
MCHC: 32.5 g/dL (ref 30.0–36.0)
MCV: 79.7 fL — ABNORMAL LOW (ref 80.0–100.0)
Platelets: 277 10*3/uL (ref 150–400)
RBC: 4.98 MIL/uL (ref 3.87–5.11)
RDW: 13.1 % (ref 11.5–15.5)
WBC: 7.5 10*3/uL (ref 4.0–10.5)
nRBC: 0 % (ref 0.0–0.2)

## 2022-07-01 LAB — BASIC METABOLIC PANEL
Anion gap: 8 (ref 5–15)
BUN: 14 mg/dL (ref 6–20)
CO2: 26 mmol/L (ref 22–32)
Calcium: 8.9 mg/dL (ref 8.9–10.3)
Chloride: 102 mmol/L (ref 98–111)
Creatinine, Ser: 0.6 mg/dL (ref 0.44–1.00)
GFR, Estimated: >60 mL/min (ref >60)
Glucose, Bld: 94 mg/dL (ref 70–99)
Potassium: 3.6 mmol/L (ref 3.5–5.1)
Sodium: 136 mmol/L (ref 135–145)

## 2022-07-01 LAB — TROPONIN I (HIGH SENSITIVITY): Troponin I (High Sensitivity): <2 ng/L (ref <18)

## 2022-07-01 LAB — PREGNANCY, URINE: Preg Test, Ur: NEGATIVE

## 2022-07-01 NOTE — ED Notes (Signed)
Patient transported to X-ray 

## 2022-07-01 NOTE — ED Triage Notes (Signed)
Pt states left sided chest tightness radiating to left side of neck x 1 week  Denies any SOB

## 2022-07-31 DIAGNOSIS — S83212A Bucket-handle tear of medial meniscus, current injury, left knee, initial encounter: Secondary | ICD-10-CM | POA: Diagnosis not present

## 2022-09-03 DIAGNOSIS — S83242A Other tear of medial meniscus, current injury, left knee, initial encounter: Secondary | ICD-10-CM | POA: Diagnosis not present

## 2022-09-03 DIAGNOSIS — S83212A Bucket-handle tear of medial meniscus, current injury, left knee, initial encounter: Secondary | ICD-10-CM | POA: Diagnosis not present

## 2022-09-24 DIAGNOSIS — Z1159 Encounter for screening for other viral diseases: Secondary | ICD-10-CM | POA: Diagnosis not present

## 2022-09-26 ENCOUNTER — Other Ambulatory Visit: Payer: Self-pay

## 2022-09-26 DIAGNOSIS — M542 Cervicalgia: Secondary | ICD-10-CM | POA: Diagnosis not present

## 2022-09-26 DIAGNOSIS — Z20822 Contact with and (suspected) exposure to covid-19: Secondary | ICD-10-CM | POA: Insufficient documentation

## 2022-09-26 DIAGNOSIS — R5383 Other fatigue: Secondary | ICD-10-CM | POA: Diagnosis not present

## 2022-09-26 DIAGNOSIS — M791 Myalgia, unspecified site: Secondary | ICD-10-CM | POA: Diagnosis not present

## 2022-09-26 DIAGNOSIS — E876 Hypokalemia: Secondary | ICD-10-CM | POA: Diagnosis not present

## 2022-09-26 NOTE — ED Triage Notes (Addendum)
Pt c/o all over joint pain that has been ongoing for past 3 days. Took ibuprofen without relief. Last dose yesterday night.  No cough/fevers/chills. No sick exposure. No recent travel. Mobility intact. Has STD screen pending results with PCP.

## 2022-09-26 NOTE — ED Triage Notes (Signed)
When pt was brought back for labs, pt states concern for lower pressure pain with urination.

## 2022-09-26 NOTE — ED Notes (Signed)
Discussed pt s/s with Pa Brooke. Placed orders for lab work.

## 2022-09-27 ENCOUNTER — Emergency Department (HOSPITAL_BASED_OUTPATIENT_CLINIC_OR_DEPARTMENT_OTHER): Payer: 59

## 2022-09-27 ENCOUNTER — Emergency Department (HOSPITAL_BASED_OUTPATIENT_CLINIC_OR_DEPARTMENT_OTHER)
Admission: EM | Admit: 2022-09-27 | Discharge: 2022-09-27 | Disposition: A | Discharge status: Home / Self Care | Payer: 59 | Attending: Emergency Medicine | Admitting: Emergency Medicine

## 2022-09-27 DIAGNOSIS — M791 Myalgia, unspecified site: Secondary | ICD-10-CM | POA: Diagnosis not present

## 2022-09-27 DIAGNOSIS — E876 Hypokalemia: Secondary | ICD-10-CM

## 2022-09-27 DIAGNOSIS — R5383 Other fatigue: Secondary | ICD-10-CM | POA: Diagnosis not present

## 2022-09-27 LAB — URINALYSIS, ROUTINE W REFLEX MICROSCOPIC
Bilirubin Urine: NEGATIVE
Glucose, UA: NEGATIVE mg/dL
Ketones, ur: NEGATIVE mg/dL
Leukocytes,Ua: NEGATIVE
Nitrite: NEGATIVE
Protein, ur: NEGATIVE mg/dL
Specific Gravity, Urine: 1.015 (ref 1.005–1.030)
pH: 6 (ref 5.0–8.0)

## 2022-09-27 LAB — BASIC METABOLIC PANEL
Anion gap: 10 (ref 5–15)
BUN: 16 mg/dL (ref 6–20)
CO2: 22 mmol/L (ref 22–32)
Calcium: 9.4 mg/dL (ref 8.9–10.3)
Chloride: 105 mmol/L (ref 98–111)
Creatinine, Ser: 0.86 mg/dL (ref 0.44–1.00)
GFR, Estimated: >60 mL/min (ref >60)
Glucose, Bld: 66 mg/dL — ABNORMAL LOW (ref 70–99)
Potassium: 3.2 mmol/L — ABNORMAL LOW (ref 3.5–5.1)
Sodium: 137 mmol/L (ref 135–145)

## 2022-09-27 LAB — CBC WITH DIFFERENTIAL/PLATELET
Abs Immature Granulocytes: 0.01 10*3/uL (ref 0.00–0.07)
Basophils Absolute: 0 10*3/uL (ref 0.0–0.1)
Basophils Relative: 0 %
Eosinophils Absolute: 0.2 10*3/uL (ref 0.0–0.5)
Eosinophils Relative: 2 %
HCT: 41.4 % (ref 36.0–46.0)
Hemoglobin: 13.9 g/dL (ref 12.0–15.0)
Immature Granulocytes: 0 %
Lymphocytes Relative: 40 %
Lymphs Abs: 3 10*3/uL (ref 0.7–4.0)
MCH: 26.3 pg (ref 26.0–34.0)
MCHC: 33.6 g/dL (ref 30.0–36.0)
MCV: 78.4 fL — ABNORMAL LOW (ref 80.0–100.0)
Monocytes Absolute: 0.5 10*3/uL (ref 0.1–1.0)
Monocytes Relative: 7 %
Neutro Abs: 3.8 10*3/uL (ref 1.7–7.7)
Neutrophils Relative %: 51 %
Platelets: 259 10*3/uL (ref 150–400)
RBC: 5.28 MIL/uL — ABNORMAL HIGH (ref 3.87–5.11)
RDW: 13.1 % (ref 11.5–15.5)
WBC: 7.5 10*3/uL (ref 4.0–10.5)
nRBC: 0 % (ref 0.0–0.2)

## 2022-09-27 LAB — TROPONIN I (HIGH SENSITIVITY): Troponin I (High Sensitivity): <2 ng/L (ref <18)

## 2022-09-27 LAB — GROUP A STREP BY PCR: Group A Strep by PCR: NOT DETECTED

## 2022-09-27 LAB — CK: Total CK: 76 U/L (ref 38–234)

## 2022-09-27 LAB — C-REACTIVE PROTEIN: CRP: <0.5 mg/dL (ref <1.0)

## 2022-09-27 LAB — SARS CORONAVIRUS 2 BY RT PCR: SARS Coronavirus 2 by RT PCR: NEGATIVE

## 2022-09-27 LAB — SEDIMENTATION RATE: Sed Rate: 7 mm/hr (ref 0–22)

## 2022-09-27 LAB — CBG MONITORING, ED: Glucose-Capillary: 100 mg/dL — ABNORMAL HIGH (ref 70–99)

## 2022-09-27 LAB — URINALYSIS, MICROSCOPIC (REFLEX)

## 2022-09-27 LAB — PREGNANCY, URINE: Preg Test, Ur: NEGATIVE

## 2022-09-27 LAB — MAGNESIUM: Magnesium: 2.1 mg/dL (ref 1.7–2.4)

## 2022-09-27 MED ORDER — POTASSIUM CHLORIDE CRYS ER 20 MEQ PO TBCR
40.0000 meq | EXTENDED_RELEASE_TABLET | Freq: Once | ORAL | Status: AC
  Administered 2022-09-27: 40 meq via ORAL
  Filled 2022-09-27: qty 2

## 2022-09-27 MED ORDER — POTASSIUM CHLORIDE CRYS ER 20 MEQ PO TBCR: 20.0000 meq | EXTENDED_RELEASE_TABLET | Freq: Two times a day (BID) | ORAL | 0 refills | Status: DC

## 2022-09-27 NOTE — ED Provider Notes (Signed)
[place] [facility] Provider Note   CSN: [id number] Arrival date & time: 09/26/22  2257     History  Chief Complaint  Patient presents with   Generalized Body Aches    Deborah Rodgers is a 32 y.o. female.  Patient presents with aches to all of her joints for the past 4 to 5 days.  States it started as an ache to her left thumb and hand and was not sure if she had some occult trauma.  Over the next days she has progressive joint pain involving her bilateral hands, wrists, elbows, knees, ankles, hips and shoulders.  No known fever.  Has also had some right-sided neck pain that radiates down in her chest ongoing for the same 4 to 5 days.  No difficulty breathing or difficulty swallowing.  No exertional chest pain or shortness of breath.  No nausea or vomiting.  No abdominal pain.  No rash.  No travel or sick contacts.  No significant headache.  No known tick bites.  No travel or other known exposures.  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 No history of any kind of joint problems  The history is provided by the patient.       Home Medications Prior to Admission medications   Medication Sig Start Date End Date Taking? Authorizing Provider  amoxicillin (AMOXIL) 500 MG tablet Take 500 mg by mouth 2 (two) times daily.    [provider]  doxycycline (VIBRAMYCIN) 100 MG capsule Take 1 capsule (100 mg total) by mouth 2 (two) times daily. One po bid x 7 days 09/24/17   Molpus, John, MD  meloxicam (MOBIC) 15 MG tablet Take 1 tablet (15 mg total) by mouth daily. 01/21/22   Felecia Shelling, DPM  methylPREDNISolone (MEDROL DOSEPAK) 4 MG TBPK tablet 6 day dose pack - take as directed 01/21/22   Felecia Shelling, DPM  naproxen (NAPROSYN) 500 MG tablet Take 1 tablet (500 mg total) by mouth 2 (two) times daily as needed. 09/01/18   Blane Ohara, MD  predniSONE (DELTASONE) 5 MG tablet Take 6 pills for first day, 5 pills second day, 4 pills third day, 3  pills fourth day, 2 pills the fifth day, and 1 pill sixth day. 05/22/20   Myra Rude, MD      Allergies    Patient has no known allergies.    Review of Systems   Review of Systems  Constitutional:  Positive for fatigue. Negative for activity change, appetite change and fever.  HENT:  Negative for congestion and rhinorrhea.   Respiratory:  Negative for cough, chest tightness and shortness of breath.   Gastrointestinal:  Negative for abdominal pain, nausea and vomiting.  Genitourinary:  Negative for dysuria and hematuria.  Musculoskeletal:  Positive for arthralgias, joint swelling, myalgias and neck pain.  Skin:  Negative for rash.  Neurological:  Negative for dizziness, weakness and headaches.    Physical Exam Updated Vital Signs BP (!) 133/93 (BP Location: Right Arm)   Pulse (!) 103   Temp 98.3 F (36.8 C) (Oral)   Resp 16   Ht 5\' 6"  (1.676 m)   Wt 77.1 kg   SpO2 100%   BMI 27.44 kg/m  Physical Exam Vitals and nursing note reviewed.  Constitutional:      General: She is not in acute distress.    Appearance: She is well-developed.  HENT:     Head: Normocephalic and atraumatic.  Mouth/Throat:     Pharynx: No oropharyngeal exudate or posterior oropharyngeal erythema.  Eyes:     Conjunctiva/sclera: Conjunctivae normal.     Pupils: Pupils are equal, round, and reactive to light.  Neck:     Comments: No meningismus. Tender right anterior neck without appreciable lymphadenopathy. Cardiovascular:     Rate and Rhythm: Normal rate and regular rhythm.     Heart sounds: Normal heart sounds. No murmur heard. Pulmonary:     Effort: Pulmonary effort is normal. No respiratory distress.     Breath sounds: Normal breath sounds.  Chest:     Chest wall: Tenderness present.  Abdominal:     Palpations: Abdomen is soft.     Tenderness: There is no abdominal tenderness. There is no guarding or rebound.  Musculoskeletal:        General: No tenderness. Normal range of motion.      Cervical back: Normal range of motion and neck supple.     Comments: Full range of motion of all major joints without erythema or swelling.  Skin:    General: Skin is warm.     Findings: No rash.     Comments: No rash on exposed skin  Neurological:     Mental Status: She is alert and oriented to person, place, and time.     Cranial Nerves: No cranial nerve deficit.     Motor: No abnormal muscle tone.     Coordination: Coordination normal.     Comments:  5/5 strength throughout. CN 2-12 intact.Equal grip strength.   Psychiatric:        Behavior: Behavior normal.     ED Results / Procedures / Treatments   Labs (all labs ordered are listed, but only abnormal results are displayed) Labs Reviewed  CBC WITH DIFFERENTIAL/PLATELET - Abnormal; Notable for the following components:      Result Value   RBC 5.28 (*)    MCV 78.4 (*)    All other components within normal limits  BASIC METABOLIC PANEL - Abnormal; Notable for the following components:   Potassium 3.2 (*)    Glucose, Bld 66 (*)    All other components within normal limits  URINALYSIS, ROUTINE W REFLEX MICROSCOPIC - Abnormal; Notable for the following components:   Hgb urine dipstick TRACE (*)    All other components within normal limits  URINALYSIS, MICROSCOPIC (REFLEX) - Abnormal; Notable for the following components:   Bacteria, UA RARE (*)    All other components within normal limits  SARS CORONAVIRUS 2 BY RT PCR  GROUP A STREP BY PCR  PREGNANCY, URINE  CK  SEDIMENTATION RATE  MAGNESIUM  LYME DISEASE SEROLOGY W/REFLEX  C-REACTIVE PROTEIN  CBG MONITORING, ED  TROPONIN I (HIGH SENSITIVITY)  TROPONIN I (HIGH SENSITIVITY)    EKG EKG Interpretation  Date/Time:  Sunday September 27 2022 01:11:16 EDT Ventricular Rate:  72 PR Interval:  143 QRS Duration: 95 QT Interval:  367 QTC Calculation: 402 R Axis:   94 Text Interpretation: Sinus rhythm Borderline right axis deviation Low voltage, precordial leads  Borderline T wave abnormalities No significant change was found Confirmed by Glynn Octave [phone]) on 09/27/2022 1:14:49 AM  Radiology DG Chest 2 View  Result Date: 09/27/2022 CLINICAL DATA:  Generalized body aches x3 days. EXAM: CHEST - 2 VIEW COMPARISON:  July 01, 2022 FINDINGS: The heart size and mediastinal contours are within normal limits. Both lungs are clear. The visualized skeletal structures are unremarkable. IMPRESSION: No active cardiopulmonary disease. Electronically Signed  By: Aram Candelahaddeus  Houston M.D.   On: 09/27/2022 01:52    Procedures Procedures    Medications Ordered in ED Medications  potassium chloride SA (KLOR-CON M) CR tablet 40 mEq (has no administration in time range)    ED Course/ Medical Decision Making/ A&P                             Medical Decision Making Amount and/or Complexity of Data Reviewed Labs: ordered. Decision-making details documented in ED Course. Radiology: ordered and independent interpretation performed. Decision-making details documented in ED Course. ECG/medicine tests: ordered and independent interpretation performed. Decision-making details documented in ED Course.  Risk Prescription drug management.  5 days of diffuse joint pain without fever.  No rash.  Appears well with stable vital signs.  No evidence of septic joint.  Basic lab work unremarkable with no leukocytosis.  No known tick bites.  Patient appears well.  Blood work is reassuring.  Mild hypokalemia may be contributing to her muscle aches.  Mild hypoglycemia which improved.  No leukocytosis.  ESR and CRP are sent and pending.  Tickborne illnesses considered and titers of [facility] spotted fever and Lyme disease are sent.  Patient informed of these results would not be back today.  She appears well and nontoxic.  States she is currently being evaluated for STDs by her PCP and waiting for results.  Her presentation today does not appear consistent with an STD and she  declines empiric treatment. UA negative for infection.  Treat hypokalemia, increase fluid intake at home, follow-up tickborne illness titers and follow-up with PCP.  Discussed if symptoms do not improve she may require referral to rheumatology.  Return to ED if difficulty breathing, chest pain, unable to eat or drink or any concerns        Final Clinical Impression(s) / ED Diagnoses Final diagnoses:  Myalgia  Hypokalemia    Rx / DC Orders ED Discharge Orders     None         Tayvon Culley, Jeannett SeniorStephen, MD 09/27/22 0234

## 2022-09-27 NOTE — Discharge Instructions (Signed)
Increase your hydration and take the potassium supplementation as prescribed.  You can check the results of your [facility] spotted fever and Lyme disease titers on MyChart.  Follow-up with your doctor for recheck of your potassium next week.  If your aches do not improve you may require evaluation by rheumatology.  Return to the ED with difficulty breathing, chest pain, shortness of breath, not able to eat or drink or any other concerns.

## 2022-09-27 NOTE — ED Notes (Signed)
Edp at bedside °

## 2022-09-27 NOTE — ED Notes (Signed)
Discharge instructions provided by edp were discussed with pt. Pt verbalized understanding with no additional questions at this time.  

## 2022-09-27 NOTE — ED Notes (Signed)
Patient transported to X-ray 

## 2022-09-28 LAB — LYME IGG/IGM
Lyme IgG EIA: UNDETERMINED
Lyme IgM EIA: POSITIVE

## 2022-09-28 LAB — LYME DISEASE SEROLOGY W/REFLEX: Lyme Total Antibody EIA: UNDETERMINED

## 2022-09-28 NOTE — ED Notes (Signed)
Prescription for Doxycycline called into [facility] Doxy 100 mg BID  x 7days #14 NRF per Dr Silverio Lay

## 2022-09-30 DIAGNOSIS — N898 Other specified noninflammatory disorders of vagina: Secondary | ICD-10-CM | POA: Diagnosis not present

## 2022-10-05 ENCOUNTER — Encounter: Payer: Self-pay | Admitting: *Deleted

## 2022-10-10 ENCOUNTER — Emergency Department (HOSPITAL_BASED_OUTPATIENT_CLINIC_OR_DEPARTMENT_OTHER)
Admission: EM | Admit: 2022-10-10 | Discharge: 2022-10-10 | Disposition: A | Discharge status: Home / Self Care | Payer: 59 | Attending: Emergency Medicine | Admitting: Emergency Medicine

## 2022-10-10 ENCOUNTER — Encounter (HOSPITAL_BASED_OUTPATIENT_CLINIC_OR_DEPARTMENT_OTHER): Payer: Self-pay | Admitting: Emergency Medicine

## 2022-10-10 DIAGNOSIS — M2550 Pain in unspecified joint: Secondary | ICD-10-CM | POA: Diagnosis not present

## 2022-10-10 DIAGNOSIS — R202 Paresthesia of skin: Secondary | ICD-10-CM | POA: Diagnosis present

## 2022-10-10 LAB — CBC
HCT: 39.3 % (ref 36.0–46.0)
Hemoglobin: 13 g/dL (ref 12.0–15.0)
MCH: 25.6 pg — ABNORMAL LOW (ref 26.0–34.0)
MCHC: 33.1 g/dL (ref 30.0–36.0)
MCV: 77.5 fL — ABNORMAL LOW (ref 80.0–100.0)
Platelets: 235 10*3/uL (ref 150–400)
RBC: 5.07 MIL/uL (ref 3.87–5.11)
RDW: 13 % (ref 11.5–15.5)
WBC: 5.7 10*3/uL (ref 4.0–10.5)
nRBC: 0 % (ref 0.0–0.2)

## 2022-10-10 LAB — BASIC METABOLIC PANEL
Anion gap: 6 (ref 5–15)
BUN: 16 mg/dL (ref 6–20)
CO2: 26 mmol/L (ref 22–32)
Calcium: 8.6 mg/dL — ABNORMAL LOW (ref 8.9–10.3)
Chloride: 104 mmol/L (ref 98–111)
Creatinine, Ser: 0.6 mg/dL (ref 0.44–1.00)
GFR, Estimated: >60 mL/min (ref >60)
Glucose, Bld: 91 mg/dL (ref 70–99)
Potassium: 3.9 mmol/L (ref 3.5–5.1)
Sodium: 136 mmol/L (ref 135–145)

## 2022-10-10 NOTE — ED Notes (Signed)
D/c paperwork reviewed with pt, including follow up care.  No questions or concerns voiced at time of d/c. . Pt verbalized understanding, Ambulatory without assistance to ED exit, NAD.   

## 2022-10-10 NOTE — ED Provider Notes (Signed)
[place] [facility] Provider Note   CSN: [id number] Arrival date & time: 10/10/22  1252     History  Chief Complaint  Patient presents with   Joint Pain    Deborah Rodgers is a 32 y.o. female.  HPI Patient with joint pain, tingling in hands and feet.  Recently seen and about 2 weeks ago diagnosed with Lyme disease.  Had 7-day course of doxycycline and states that she was feeling better until she finished antibiotics and came back.  Today had the tingling in her hands and feet.  Does not have a PCP.  I reviewed notes from visit including Lyme titers.   History reviewed. No pertinent past medical history.  Home Medications Prior to Admission medications   Medication Sig Start Date End Date Taking? Authorizing Provider  amoxicillin (AMOXIL) 500 MG tablet Take 500 mg by mouth 2 (two) times daily.    [provider]  doxycycline (VIBRAMYCIN) 100 MG capsule Take 1 capsule (100 mg total) by mouth 2 (two) times daily. One po bid x 7 days 09/24/17   Molpus, John, MD  meloxicam (MOBIC) 15 MG tablet Take 1 tablet (15 mg total) by mouth daily. 01/21/22   Felecia Shelling, DPM  methylPREDNISolone (MEDROL DOSEPAK) 4 MG TBPK tablet 6 day dose pack - take as directed 01/21/22   Felecia Shelling, DPM  naproxen (NAPROSYN) 500 MG tablet Take 1 tablet (500 mg total) by mouth 2 (two) times daily as needed. 09/01/18   Blane Ohara, MD  potassium chloride SA (KLOR-CON M) 20 MEQ tablet Take 1 tablet (20 mEq total) by mouth 2 (two) times daily. 09/27/22   Rancour, Jeannett Senior, MD  predniSONE (DELTASONE) 5 MG tablet Take 6 pills for first day, 5 pills second day, 4 pills third day, 3 pills fourth day, 2 pills the fifth day, and 1 pill sixth day. 05/22/20   Myra Rude, MD      Allergies    Patient has no known allergies.    Review of Systems   Review of Systems  Physical Exam Updated Vital Signs BP 120/83   Pulse 97   Temp 98.2 F (36.8 C) (Oral)   Resp 20    Ht  (1.676 m)   Wt 77.1 kg   LMP 09/30/2022   SpO2 100%   BMI 27.44 kg/m  Physical Exam Vitals and nursing note reviewed.  Cardiovascular:     Rate and Rhythm: Regular rhythm.  Abdominal:     Tenderness: There is no abdominal tenderness.  Musculoskeletal:        General: No tenderness.     Cervical back: Neck supple.  Neurological:     General: No focal deficit present.     Mental Status: She is alert.     ED Results / Procedures / Treatments   Labs (all labs ordered are listed, but only abnormal results are displayed) Labs Reviewed  BASIC METABOLIC PANEL - Abnormal; Notable for the following components:      Result Value   Calcium 8.6 (*)    All other components within normal limits  CBC - Abnormal; Notable for the following components:   MCV 77.5 (*)    MCH 25.6 (*)    All other components within normal limits    EKG None  Radiology No results found.  Procedures Procedures    Medications Ordered in ED Medications - No data to display  ED Course/ Medical Decision Making/ A&P  Medical Decision Making Amount and/or Complexity of Data Reviewed Labs: ordered.   Patient with joint pains.  Tingling hands and feet.  Mild hypocalcemia potentially could cause some tingling.  Also has had malaise.  Discussed however with Dr. Luciana Axe from infectious disease.  Doubt this is a true positive Lyme disease.  No recent travel has not been pandemic Lyme area.  Does have IgG that was inconclusive and IgM that was elevated.  Thought to be false positive.  Recommended follow-up with retesting if needed.  Discussed with patient.  Does not have PCP and will follow-up with either PCP or infectious disease. Infectious ease had requested not starting back on doxycycline.  Will discharge home.        Final Clinical Impression(s) / ED Diagnoses Final diagnoses:  Arthralgia, unspecified joint  Hypocalcemia    Rx / DC Orders ED Discharge  Orders     None         Benjiman Core, MD 10/10/22 1552

## 2022-10-10 NOTE — ED Triage Notes (Signed)
Pt sts she was dx w/ Lyme disease recently; has completed abx, but continues to have joint pain, general malaise; sts  hands felt numb last night, but that subsided when she started using her hands this morning

## 2022-10-13 DIAGNOSIS — M1990 Unspecified osteoarthritis, unspecified site: Secondary | ICD-10-CM | POA: Insufficient documentation

## 2022-10-28 ENCOUNTER — Inpatient Hospital Stay: Payer: 59 | Admitting: Internal Medicine

## 2023-02-10 ENCOUNTER — Other Ambulatory Visit: Payer: Self-pay | Admitting: Podiatry

## 2023-02-10 DIAGNOSIS — M722 Plantar fascial fibromatosis: Secondary | ICD-10-CM

## 2023-02-10 MED ORDER — METHYLPREDNISOLONE 4 MG PO TBPK
ORAL_TABLET | ORAL | 0 refills | Status: DC
Start: 2023-02-10 — End: 2023-02-11

## 2023-02-10 NOTE — Progress Notes (Signed)
Patient seen with plantar right heel pain.  Denies injury.    On exam, no ecchymosis or edema.  No erythema.  POP right plantarmedial heel.  No gaps or nodules in plantar fascia.  A/ Plantar fasciitis with bursitis right foot.  P/ Low-dye strap applied right foot.      Rx Medrol Dose pack sent to her pharmacy.   F/u prn

## 2023-02-11 ENCOUNTER — Other Ambulatory Visit: Payer: Self-pay | Admitting: Podiatry

## 2023-02-11 DIAGNOSIS — M722 Plantar fascial fibromatosis: Secondary | ICD-10-CM

## 2023-02-11 MED ORDER — METHYLPREDNISOLONE 4 MG PO TBPK
ORAL_TABLET | ORAL | 0 refills | Status: DC
Start: 2023-02-11 — End: 2023-03-15

## 2023-02-11 NOTE — Progress Notes (Signed)
Changed pharmacy at patient request.  Initial Rx went to incorrect pharmacy.

## 2023-03-15 ENCOUNTER — Other Ambulatory Visit: Payer: Self-pay | Admitting: Podiatry

## 2023-03-15 DIAGNOSIS — M722 Plantar fascial fibromatosis: Secondary | ICD-10-CM

## 2023-03-15 MED ORDER — METHYLPREDNISOLONE 4 MG PO TBPK
ORAL_TABLET | ORAL | 0 refills | Status: DC
Start: 2023-03-15 — End: 2023-10-05

## 2023-03-19 ENCOUNTER — Encounter: Payer: Self-pay | Admitting: Podiatry

## 2023-03-19 ENCOUNTER — Ambulatory Visit (INDEPENDENT_AMBULATORY_CARE_PROVIDER_SITE_OTHER): Payer: 59 | Admitting: Podiatry

## 2023-03-19 ENCOUNTER — Ambulatory Visit: Payer: 59 | Admitting: Podiatry

## 2023-03-19 ENCOUNTER — Ambulatory Visit (INDEPENDENT_AMBULATORY_CARE_PROVIDER_SITE_OTHER): Payer: 59

## 2023-03-19 DIAGNOSIS — M722 Plantar fascial fibromatosis: Secondary | ICD-10-CM | POA: Diagnosis not present

## 2023-03-19 DIAGNOSIS — M7751 Other enthesopathy of right foot: Secondary | ICD-10-CM

## 2023-03-19 MED ORDER — TRIAMCINOLONE ACETONIDE 10 MG/ML IJ SUSP
10.0000 mg | Freq: Once | INTRAMUSCULAR | Status: AC
Start: 2023-03-19 — End: 2023-03-19
  Administered 2023-03-19: 10 mg via INTRAMUSCULAR

## 2023-03-19 NOTE — Progress Notes (Signed)
      HPI: 32 y.o. female presenting today with c/o pain in the bottom of the right heel.  Pain is rated as 8-10/10.  She has been taking the Medrol Dosepak which was prescribed earlier this week and notes mild improvement of symptoms.  Has pain with for steps after arising from a sitting position.  Pain is worse with prolonged standing on hard surfaces throughout the day  History reviewed. No pertinent past medical history.  Past Surgical History:  Procedure Laterality Date   BREAST SURGERY     LIPOSUCTION     LIPOSUCTION W/ FAT INJECTION  10/28/2017   No Known Allergies   Physical Exam: General: The patient is alert and oriented x3 in no acute distress.  Dermatology:  No ecchymosis, erythema, or edema bilateral.  No open lesions.    Vascular: Palpable pedal pulses bilaterally. Capillary refill within normal limits.  No appreciable edema.    Musculoskeletal Exam:  There is pain on palpation of the plantarcentral aspect of the right heel.  No gaps or nodules within the plantar fascia.  Positive Windlass mechanism bilateral.  Antalgic gait noted with first few steps upon standing.  Minimal to no pain on palpation of achilles tendon right heel.  Ankle df wnl.  Radiographic Exam (right foot, 3 weightbearing views, 03/19/2023:  Normal osseous mineralization. Joint spaces preserved.  No fractures or osseous irregularities noted.  Very tiny inferior calcaneal noted.  No posterior calcaneal spur seen.  No degenerative joint changes seen  Assessment/Plan of Care: 1. Plantar fasciitis of right foot   2. Bursitis of right foot     Meds ordered this encounter  Medications   triamcinolone acetonide (KENALOG) 10 MG/ML injection 10 mg   -Reviewed etiology of plantar fasciitis with patient.  Discussed treatment options with patient today, including cortisone injection, NSAID course of treatment, stretching exercises, physical therapy, use of night splint, rest, icing the heel, arch  supports/orthotics, and supportive shoe gear.    With the patient's verbal consent, a corticosteroid injection was administered to the right heel, consisting of a mixture of 1% lidocaine plain, 0.5% Sensorcaine plain, and Kenalog-10 for a total of 1.5cc administered.  A Band-aid was applied. Pain level post-injection is 5/10.  A low-dye strapping was applied to the right foot.  She can keep this on for 3 days if she is able to keep the tape dry.  Otherwise she can remove tonight or tomorrow.  A gel heel cushion/lifts were dispensed.  Resume stretching exercises in 2 to 3 days.  Return if symptoms worsen or fail to improve.   Clerance Lav, DPM, FACFAS [facility]     [address] [place], [place] [address]                Office [phone]  Fax [phone]

## 2023-06-08 ENCOUNTER — Ambulatory Visit (INDEPENDENT_AMBULATORY_CARE_PROVIDER_SITE_OTHER): Payer: 59 | Admitting: Podiatry

## 2023-06-08 DIAGNOSIS — M722 Plantar fascial fibromatosis: Secondary | ICD-10-CM | POA: Diagnosis not present

## 2023-06-08 NOTE — Progress Notes (Signed)
      Chief Complaint  Patient presents with   Foot Pain    Patient is here for right heel pain F/U   HPI: 32 y.o. female presenting today for f/u of right plantar fasciitis.  Still having some pain in central portion of plantar heel.  No injury since last seen.  Performing stretching exercises at home and wearing arch supports.   No past medical history on file.  Past Surgical History:  Procedure Laterality Date   BREAST SURGERY     LIPOSUCTION     LIPOSUCTION W/ FAT INJECTION  10/28/2017   No Known Allergies   Physical Exam: General: The patient is alert and oriented x3 in no acute distress.  Dermatology:  No ecchymosis, erythema, or edema bilateral.  No open lesions.    Vascular: Palpable pedal pulses bilaterally. Capillary refill within normal limits.  No appreciable edema.    Musculoskeletal Exam:  There is pain on palpation of the plantarcentral aspect of right heel.  No gaps or nodules within the plantar fascia.  Positive Windlass mechanism bilateral.  Antalgic gait noted with first few steps upon standing.  No pain on palpation of achilles tendon bilateral.  Ankle df wnl b/l  Assessment/Plan of Care: 1. Plantar fasciitis of right foot     Reviewed etiology of plantar fasciitis with patient.  Discussed treatment options with patient today, including cortisone injection, NSAID course of treatment, stretching exercises, physical therapy, use of night splint, rest, icing the heel, arch supports/orthotics, and supportive shoe gear.    With the patient's verbal consent, a corticosteroid injection was administered to the right  heel, consisting of a mixture of 1% lidocaine plain, 0.5% Sensorcaine plain, and Kenalog-10 for a total of 1.5cc administered.  A Band-aid was applied.   A low-dye strapping was applied as a courtesy today to the right foot.  Return if symptoms worsen or fail to improve.   Clerance Lav, DPM, FACFAS [facility]     [address]  [place], [place] [address]                Office [phone]  Fax [phone]

## 2023-10-05 ENCOUNTER — Encounter: Payer: Self-pay | Admitting: Podiatry

## 2023-10-05 ENCOUNTER — Ambulatory Visit (INDEPENDENT_AMBULATORY_CARE_PROVIDER_SITE_OTHER): Admitting: Podiatry

## 2023-10-05 DIAGNOSIS — M722 Plantar fascial fibromatosis: Secondary | ICD-10-CM | POA: Diagnosis not present

## 2023-10-05 NOTE — Progress Notes (Unsigned)
      Chief Complaint  Patient presents with   Foot Pain    Follow up PF right   "It does good for a few months and then it comes back"   HPI: 33 y.o. female presents today, with her daughter present, with concern of recurrence of her right plantar fasciitis.  She notes most of the pain is on the plantar lateral aspect of the heel at this time.  Denies any recent injury.  She is getting married next month.  No past medical history on file.  Past Surgical History:  Procedure Laterality Date   BREAST SURGERY     LIPOSUCTION     LIPOSUCTION W/ FAT INJECTION  10/28/2017   No Known Allergies   Physical Exam: Palpable pedal pulses noted.  Pain on palpation to the plantar lateral aspect of the right heel.  There is minimal pain on palpation to the plantar medial aspect of the right heel.  No surrounding ecchymosis or erythema is noted.  No edema is appreciated.  No open lesions are noted.  Ankle dorsiflexion is within normal limits  Assessment/Plan of Care: 1. Plantar fasciitis of right foot    Discussed clinical findings with patient today.  With the patient's consent, a corticosteroid injection was administered to the plantar aspect of the right heel using a lateral approach.  This consisted of a mixture of 1% lidocaine plain, 0.5% bupivacaine plain, and Kenalog 10 for total of 1.2 cc administered.  She tolerated this well and a bandage was applied.  She will wait a couple days before resuming any stretching exercises.  Continue with good shoes that have arch support.  Follow-up as needed   Joe Murders, DPM, FACFAS [facility]     [address] [place], [place] [address]                Office [phone]  Fax [phone]

## 2023-10-18 ENCOUNTER — Ambulatory Visit: Admitting: Podiatry

## 2023-12-18 ENCOUNTER — Telehealth: Admitting: Nurse Practitioner

## 2023-12-18 DIAGNOSIS — R399 Unspecified symptoms and signs involving the genitourinary system: Secondary | ICD-10-CM | POA: Diagnosis not present

## 2023-12-18 MED ORDER — NITROFURANTOIN MONOHYD MACRO 100 MG PO CAPS
100.0000 mg | ORAL_CAPSULE | Freq: Two times a day (BID) | ORAL | 0 refills | Status: AC
Start: 2023-12-18 — End: 2023-12-23

## 2023-12-18 NOTE — Progress Notes (Signed)
 I have spent 5 minutes in review of e-visit questionnaire, review and updating patient chart, medical decision making and response to patient.   Claiborne Rigg, NP

## 2023-12-18 NOTE — Progress Notes (Signed)

## 2024-01-11 ENCOUNTER — Telehealth: Admitting: Family Medicine

## 2024-01-11 DIAGNOSIS — N390 Urinary tract infection, site not specified: Secondary | ICD-10-CM

## 2024-01-11 NOTE — Progress Notes (Signed)
  Because you recently had a UTI you need to give a sample this time to make sure you get the best medication we feel your condition warrants further evaluation and recommend that you be seen in a face-to-face visit at an urgent care.   NOTE: There will be NO CHARGE for this E-Visit   If you are having a true medical emergency, please call 911.

## 2024-03-07 ENCOUNTER — Ambulatory Visit: Payer: Self-pay

## 2024-03-14 ENCOUNTER — Ambulatory Visit: Admitting: Podiatry

## 2024-03-17 ENCOUNTER — Ambulatory Visit

## 2024-03-17 ENCOUNTER — Telehealth: Payer: Self-pay | Admitting: *Deleted

## 2024-03-17 ENCOUNTER — Ambulatory Visit (INDEPENDENT_AMBULATORY_CARE_PROVIDER_SITE_OTHER): Admitting: Podiatry

## 2024-03-17 ENCOUNTER — Encounter: Payer: Self-pay | Admitting: Podiatry

## 2024-03-17 DIAGNOSIS — M2041 Other hammer toe(s) (acquired), right foot: Secondary | ICD-10-CM

## 2024-03-17 NOTE — Telephone Encounter (Signed)
 I called and asked the patient to stop by imaging on her way in because we need xrays of her foot.

## 2024-03-17 NOTE — Progress Notes (Signed)
  Subjective:  Patient ID: Deborah Rodgers, female    DOB: May 29, 1991,   MRN: [id number]  Chief Complaint  Patient presents with   Hammer Toe    I have pain in between my little toe and the toe next to it.  It hurts when I wear closed toe shoes.      33 y.o. female presents for concern of corn on right foot that has been present for quite a while  .Relates it is in between her fourth and fifth toes and bothers her in certain shoes. Relates work shoes area often very painful. Relates after a few hours in shoes she is in a lot of pain.  Denies any other pedal complaints. Denies n/v/f/c.   History reviewed. No pertinent past medical history.  Objective:  Physical Exam: Vascular: DP/PT pulses 2/4 bilateral. CFT <3 seconds. Normal hair growth on digits. No edema.  Skin. No lacerations or abrasions bilateral feet. Hyperkeratotic lesion noted medial fifth digit and lateral fourth digit on the right.  Musculoskeletal: MMT 5/5 bilateral lower extremities in DF, PF, Inversion and Eversion. Deceased ROM in DF of ankle joint. Hammered fourth and fifth digit on the right with adductovarus of the fifth digit.  Neurological: Sensation intact to light touch.   Assessment:   1. Hammer toe of right foot      Plan:  Patient was evaluated and treated and all questions answered. -X-rays reviewed. Hammered digits 4 and 5 on right. No acute fractures or dislocatiosn noted.  -Educated on hammertoes and heloma molle and treatment options  -Discussed padding including toe caps and toe spacers.  -Mechanically debrided hyperkeratotic tissue without incident as courtesy.   -Discussed need for potential surgery if pain does not improved.  -Patient to follow-up as needed. Discussed calling if any changes or increased pain.    Asberry Failing, DPM

## 2024-03-21 ENCOUNTER — Ambulatory Visit
